# Patient Record
Sex: Male | Born: 1985 | Race: Black or African American | Hispanic: No | Marital: Single | Smoking: Current every day smoker
Health system: Southern US, Community
[De-identification: ages and names within clinical notes are randomized; demographics above are authoritative.]

## PROBLEM LIST (undated history)

## (undated) DIAGNOSIS — C259 Malignant neoplasm of pancreas, unspecified: Secondary | ICD-10-CM

## (undated) DIAGNOSIS — B20 Human immunodeficiency virus [HIV] disease: Secondary | ICD-10-CM

## (undated) DIAGNOSIS — I10 Essential (primary) hypertension: Secondary | ICD-10-CM

## (undated) DIAGNOSIS — Z21 Asymptomatic human immunodeficiency virus [HIV] infection status: Secondary | ICD-10-CM

---
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
## 2006-04-11 ENCOUNTER — Emergency Department (HOSPITAL_COMMUNITY): Admission: EM | Admit: 2006-04-11 | Discharge: 2006-04-11 | Payer: Self-pay | Admitting: Emergency Medicine

## 2006-04-14 ENCOUNTER — Emergency Department (HOSPITAL_COMMUNITY): Admission: EM | Admit: 2006-04-14 | Discharge: 2006-04-14 | Payer: Self-pay | Admitting: Emergency Medicine

## 2006-08-17 ENCOUNTER — Emergency Department (HOSPITAL_COMMUNITY): Admission: EM | Admit: 2006-08-17 | Discharge: 2006-08-17 | Payer: Self-pay | Admitting: Emergency Medicine

## 2006-10-30 ENCOUNTER — Emergency Department (HOSPITAL_COMMUNITY): Admission: EM | Admit: 2006-10-30 | Discharge: 2006-10-31 | Payer: Self-pay | Admitting: Emergency Medicine

## 2006-11-14 ENCOUNTER — Emergency Department (HOSPITAL_COMMUNITY): Admission: EM | Admit: 2006-11-14 | Discharge: 2006-11-14 | Payer: Self-pay | Admitting: Emergency Medicine

## 2006-12-11 ENCOUNTER — Emergency Department (HOSPITAL_COMMUNITY): Admission: EM | Admit: 2006-12-11 | Discharge: 2006-12-11 | Payer: Self-pay | Admitting: Emergency Medicine

## 2007-06-11 ENCOUNTER — Emergency Department (HOSPITAL_COMMUNITY): Admission: EM | Admit: 2007-06-11 | Discharge: 2007-06-11 | Payer: Self-pay | Admitting: Emergency Medicine

## 2007-07-15 ENCOUNTER — Emergency Department (HOSPITAL_COMMUNITY): Admission: EM | Admit: 2007-07-15 | Discharge: 2007-07-15 | Payer: Self-pay | Admitting: Emergency Medicine

## 2007-09-02 ENCOUNTER — Emergency Department (HOSPITAL_COMMUNITY): Admission: EM | Admit: 2007-09-02 | Discharge: 2007-09-02 | Payer: Self-pay | Admitting: Emergency Medicine

## 2008-01-31 ENCOUNTER — Emergency Department (HOSPITAL_COMMUNITY): Admission: EM | Admit: 2008-01-31 | Discharge: 2008-01-31 | Payer: Self-pay | Admitting: Family Medicine

## 2008-05-10 ENCOUNTER — Emergency Department (HOSPITAL_COMMUNITY): Admission: EM | Admit: 2008-05-10 | Discharge: 2008-05-10 | Payer: Self-pay | Admitting: Emergency Medicine

## 2010-01-16 ENCOUNTER — Emergency Department (HOSPITAL_COMMUNITY): Admission: EM | Admit: 2010-01-16 | Discharge: 2010-01-16 | Payer: Self-pay | Admitting: Emergency Medicine

## 2010-03-21 ENCOUNTER — Emergency Department (HOSPITAL_COMMUNITY)
Admission: EM | Admit: 2010-03-21 | Discharge: 2010-03-21 | Payer: Self-pay | Source: Home / Self Care | Admitting: Emergency Medicine

## 2010-06-16 ENCOUNTER — Inpatient Hospital Stay (INDEPENDENT_AMBULATORY_CARE_PROVIDER_SITE_OTHER)
Admission: RE | Admit: 2010-06-16 | Discharge: 2010-06-16 | Disposition: A | Discharge status: Home / Self Care | Payer: Self-pay | Source: Ambulatory Visit | Attending: Family Medicine | Admitting: Family Medicine

## 2010-06-16 DIAGNOSIS — K047 Periapical abscess without sinus: Secondary | ICD-10-CM

## 2010-07-15 ENCOUNTER — Inpatient Hospital Stay (INDEPENDENT_AMBULATORY_CARE_PROVIDER_SITE_OTHER)
Admission: RE | Admit: 2010-07-15 | Discharge: 2010-07-15 | Disposition: A | Discharge status: Home / Self Care | Payer: Self-pay | Source: Ambulatory Visit | Attending: Family Medicine | Admitting: Family Medicine

## 2010-07-15 DIAGNOSIS — S025XXA Fracture of tooth (traumatic), initial encounter for closed fracture: Secondary | ICD-10-CM

## 2010-07-15 DIAGNOSIS — K089 Disorder of teeth and supporting structures, unspecified: Secondary | ICD-10-CM

## 2010-07-16 ENCOUNTER — Inpatient Hospital Stay (INDEPENDENT_AMBULATORY_CARE_PROVIDER_SITE_OTHER)
Admission: RE | Admit: 2010-07-16 | Discharge: 2010-07-16 | Disposition: A | Discharge status: Home / Self Care | Payer: Self-pay | Source: Ambulatory Visit | Attending: Family Medicine | Admitting: Family Medicine

## 2010-07-16 DIAGNOSIS — K089 Disorder of teeth and supporting structures, unspecified: Secondary | ICD-10-CM

## 2010-07-16 DIAGNOSIS — K047 Periapical abscess without sinus: Secondary | ICD-10-CM

## 2010-11-17 LAB — BASIC METABOLIC PANEL
CO2: 22
Calcium: 8.7
Chloride: 107
Creatinine, Ser: 0.72
Glucose, Bld: 124 — ABNORMAL HIGH

## 2010-11-17 LAB — SAMPLE TO BLOOD BANK

## 2010-11-17 LAB — CBC
Hemoglobin: 15.2
MCHC: 34.7
MCV: 93.8
RDW: 13.4

## 2010-11-18 LAB — DIFFERENTIAL
Basophils Absolute: 0
Basophils Relative: 0
Eosinophils Absolute: 0.3
Eosinophils Relative: 3
Lymphocytes Relative: 25
Monocytes Absolute: 0.8

## 2010-11-18 LAB — POCT I-STAT, CHEM 8
BUN: 12
Calcium, Ion: 1.14
HCT: 49
Hemoglobin: 16.7
Sodium: 142
TCO2: 23

## 2010-11-18 LAB — CBC
HCT: 46.4
Hemoglobin: 16.1
MCHC: 34.6
Platelets: 226
RDW: 13.1

## 2010-12-03 LAB — I-STAT 8, (EC8 V) (CONVERTED LAB)
BUN: 6
Bicarbonate: 21.9
Bicarbonate: 25.4 — ABNORMAL HIGH
HCT: 47
HCT: 48
Hemoglobin: 16
Hemoglobin: 16.3
Operator id: 151321
Operator id: 277751
Sodium: 140
Sodium: 142
TCO2: 23
TCO2: 27
pCO2, Ven: 35.6 — ABNORMAL LOW
pCO2, Ven: 43.3 — ABNORMAL LOW

## 2010-12-03 LAB — CBC
HCT: 44.6
MCHC: 33.6
MCV: 94.3
Platelets: 236
Platelets: 255
RDW: 13.2
WBC: 11.1 — ABNORMAL HIGH
WBC: 11.4 — ABNORMAL HIGH

## 2010-12-03 LAB — POCT I-STAT CREATININE
Creatinine, Ser: 0.9
Operator id: 277751

## 2010-12-03 LAB — POCT CARDIAC MARKERS
CKMB, poc: <1 — ABNORMAL LOW
Myoglobin, poc: 35.7
Myoglobin, poc: 99.9
Operator id: 151321
Troponin i, poc: <0.05

## 2010-12-03 LAB — D-DIMER, QUANTITATIVE
D-Dimer, Quant: <0.22
D-Dimer, Quant: <0.22

## 2010-12-03 LAB — DIFFERENTIAL
Basophils Absolute: 0
Basophils Relative: 0
Eosinophils Absolute: 0.1
Eosinophils Relative: 1
Lymphocytes Relative: 35
Lymphs Abs: 3.9 — ABNORMAL HIGH
Neutro Abs: 6.3
Neutro Abs: 7.4
Neutrophils Relative %: 57
Neutrophils Relative %: 65

## 2011-01-23 ENCOUNTER — Emergency Department (HOSPITAL_COMMUNITY)
Admission: EM | Admit: 2011-01-23 | Discharge: 2011-01-23 | Disposition: A | Discharge status: Home / Self Care | Payer: Self-pay | Attending: Emergency Medicine | Admitting: Emergency Medicine

## 2011-01-23 ENCOUNTER — Encounter: Payer: Self-pay | Admitting: Emergency Medicine

## 2011-01-23 ENCOUNTER — Emergency Department (HOSPITAL_COMMUNITY): Payer: Self-pay

## 2011-01-23 DIAGNOSIS — I309 Acute pericarditis, unspecified: Secondary | ICD-10-CM | POA: Insufficient documentation

## 2011-01-23 DIAGNOSIS — R079 Chest pain, unspecified: Secondary | ICD-10-CM | POA: Insufficient documentation

## 2011-01-23 DIAGNOSIS — R0602 Shortness of breath: Secondary | ICD-10-CM | POA: Insufficient documentation

## 2011-01-23 MED ORDER — TRAMADOL HCL 50 MG PO TABS: 50.0000 mg | ORAL_TABLET | Freq: Four times a day (QID) | ORAL | Status: AC | PRN

## 2011-01-23 MED ORDER — NAPROXEN 500 MG PO TABS: 500.0000 mg | ORAL_TABLET | Freq: Two times a day (BID) | ORAL | Status: DC

## 2011-01-23 MED ORDER — OXYCODONE-ACETAMINOPHEN 5-325 MG PO TABS
2.0000 | ORAL_TABLET | Freq: Once | ORAL | Status: AC
  Administered 2011-01-23: 2 via ORAL
  Filled 2011-01-23: qty 2

## 2011-01-23 MED ORDER — IBUPROFEN 800 MG PO TABS
800.0000 mg | ORAL_TABLET | Freq: Once | ORAL | Status: AC
  Administered 2011-01-23: 800 mg via ORAL
  Filled 2011-01-23: qty 1

## 2011-01-23 NOTE — ED Notes (Signed)
Pt c/o of sob since 1am and sharp chest pains that increase with movement. Pt reports that the pain comes and goes with movement and is worse when taking a deep breath. Pt denies fever, n/v/d. Pt in no acute distress.

## 2011-01-23 NOTE — ED Notes (Signed)
ECG done

## 2011-01-23 NOTE — ED Provider Notes (Signed)
History     CSN: [id number] Arrival date & time: 01/23/2011  8:22 AM   First MD Initiated Contact with Patient 01/23/11 0857      No chief complaint on file.   (Consider location/radiation/quality/duration/timing/severity/associated sxs/prior treatment) Patient is a 25 y.o. male presenting with chest pain. The history is provided by the patient. No language interpreter was used.  Chest Pain The chest pain began 6 - 12 hours ago (started acutely at 0100). Chest pain occurs constantly. The chest pain is unchanged. The pain is associated with breathing (movement). The severity of the pain is moderate. The quality of the pain is described as aching and pleuritic. The pain does not radiate. Chest pain is worsened by certain positions and deep breathing. Primary symptoms include shortness of breath. Pertinent negatives for primary symptoms include no fever, no fatigue, no cough, no wheezing, no palpitations, no abdominal pain, no nausea, no vomiting and no dizziness.  Pertinent negatives for associated symptoms include no numbness and no weakness. He tried nothing for the symptoms. Risk factors include male gender and smoking/tobacco exposure.     No past medical history on file.  No past surgical history on file.  No family history on file.  History  Substance Use Topics  . Smoking status: Current Everyday Smoker  . Smokeless tobacco: Not on file  . Alcohol Use: Yes      Review of Systems  Constitutional: Negative for fever, activity change, appetite change and fatigue.  HENT: Negative for congestion, sore throat, rhinorrhea, neck pain and neck stiffness.   Respiratory: Positive for shortness of breath. Negative for cough, chest tightness and wheezing.   Cardiovascular: Positive for chest pain. Negative for palpitations.  Gastrointestinal: Negative for nausea, vomiting and abdominal pain.  Genitourinary: Negative for dysuria, urgency, frequency and flank pain.  Neurological:  Negative for dizziness, weakness, light-headedness, numbness and headaches.  All other systems reviewed and are negative.    Allergies  Review of patient's allergies indicates no known allergies.  Home Medications   Current Outpatient Rx  Name Route Sig Dispense Refill  . IBUPROFEN 100 MG PO TABS Oral Take 200-400 mg by mouth every 6 (six) hours as needed. For pain     . NAPROXEN 500 MG PO TABS Oral Take 1 tablet (500 mg total) by mouth 2 (two) times daily. 30 tablet 0  . TRAMADOL HCL 50 MG PO TABS Oral Take 1 tablet (50 mg total) by mouth every 6 (six) hours as needed for pain. Maximum dose= 8 tablets per day 15 tablet 0    BP 118/61  Pulse 83  Temp 97.8 F (36.6 C)  Resp 20  SpO2 100%  Physical Exam  Nursing note and vitals reviewed. Constitutional: He is oriented to person, place, and time. He appears well-developed and well-nourished. No distress.  HENT:  Head: Normocephalic and atraumatic.  Mouth/Throat: Oropharynx is clear and moist. No oropharyngeal exudate.  Eyes: Conjunctivae and EOM are normal. Pupils are equal, round, and reactive to light.  Neck: Normal range of motion.  Cardiovascular: Normal rate, regular rhythm, normal heart sounds and intact distal pulses.  Exam reveals no gallop and no friction rub.   No murmur heard. Pulmonary/Chest: Effort normal and breath sounds normal. No respiratory distress. He exhibits tenderness.  Abdominal: Soft. Bowel sounds are normal. There is no tenderness. There is no rebound and no guarding.  Musculoskeletal: Normal range of motion. He exhibits no tenderness.  Neurological: He is alert and oriented to person, place, and time.  No cranial nerve deficit.  Skin: Skin is warm and dry. No rash noted.    ED Course  Procedures (including critical care time)   Date: 01/23/2011  Rate: 90  Rhythm: normal sinus rhythm  QRS Axis: normal  Intervals: normal  ST/T Wave abnormalities: ST elevations diffusely and PR depression  consistent with pericarditis  Conduction Disutrbances:none  Narrative Interpretation:   Old EKG Reviewed: none available  Labs Reviewed - No data to display Dg Chest 2 View  01/23/2011  *RADIOLOGY REPORT*  Clinical Data: Chest pain  CHEST - 2 VIEW  Comparison:  07/15/2007  Findings:  The heart size and mediastinal contours are within normal limits.  Both lungs are clear.  The visualized skeletal structures are unremarkable.  IMPRESSION: No active cardiopulmonary disease.  Original Report Authenticated By: Judie Petit. Ruel Favors, M.D.     1. Pericarditis, acute       MDM  Patient is PERC negative with low clinical gestalt for pulmonary embolus. I'm not concerned about a cardiac etiology of his pain such as ACS. EKG is consistent with pericarditis. He'll be treated with NSAIDs and Ultram. To followup with primary care physician as needed. Once again there is no concern about ACS or pulmonary embolus at this time. He is provided clear instructions on signs and symptoms for which to return to the emergency department        Dayton Bailiff, MD 01/23/11 1021

## 2011-02-17 ENCOUNTER — Emergency Department (HOSPITAL_COMMUNITY)
Admission: EM | Admit: 2011-02-17 | Discharge: 2011-02-17 | Discharge status: Against Medical Advice | Payer: Self-pay | Attending: Emergency Medicine | Admitting: Emergency Medicine

## 2011-02-17 ENCOUNTER — Encounter (HOSPITAL_COMMUNITY): Payer: Self-pay | Admitting: Emergency Medicine

## 2011-02-17 DIAGNOSIS — K089 Disorder of teeth and supporting structures, unspecified: Secondary | ICD-10-CM | POA: Insufficient documentation

## 2011-02-17 NOTE — ED Notes (Signed)
PT. REPORTS RIGHT LOWER MOLAR CAVITY/PAIN FOR 2 DAYS .

## 2011-04-15 ENCOUNTER — Emergency Department (HOSPITAL_COMMUNITY)
Admission: EM | Admit: 2011-04-15 | Discharge: 2011-04-15 | Disposition: A | Discharge status: Home / Self Care | Payer: Self-pay | Attending: Emergency Medicine | Admitting: Emergency Medicine

## 2011-04-15 ENCOUNTER — Encounter (HOSPITAL_COMMUNITY): Payer: Self-pay | Admitting: Emergency Medicine

## 2011-04-15 DIAGNOSIS — K0889 Other specified disorders of teeth and supporting structures: Secondary | ICD-10-CM

## 2011-04-15 DIAGNOSIS — K089 Disorder of teeth and supporting structures, unspecified: Secondary | ICD-10-CM | POA: Insufficient documentation

## 2011-04-15 DIAGNOSIS — J45909 Unspecified asthma, uncomplicated: Secondary | ICD-10-CM | POA: Insufficient documentation

## 2011-04-15 MED ORDER — PENICILLIN V POTASSIUM 500 MG PO TABS: 500.0000 mg | ORAL_TABLET | Freq: Three times a day (TID) | ORAL | Status: AC

## 2011-04-15 MED ORDER — HYDROCODONE-ACETAMINOPHEN 5-500 MG PO TABS: 1.0000 | ORAL_TABLET | Freq: Four times a day (QID) | ORAL | Status: AC | PRN

## 2011-04-15 MED ORDER — IBUPROFEN 600 MG PO TABS: 600.0000 mg | ORAL_TABLET | Freq: Four times a day (QID) | ORAL | Status: AC | PRN

## 2011-04-15 NOTE — Discharge Instructions (Signed)
Take ibuprofen for pain. Vicodin as prescribed as needed for severe pain. Take penicillin as prescribed until all gone for possible infection. Follow up with oral surgery.  Dental Pain A tooth ache may be caused by cavities (tooth decay). Cavities expose the nerve of the tooth to air and hot or cold temperatures. It may come from an infection or abscess (also called a boil or furuncle) around your tooth. It is also often caused by dental caries (tooth decay). This causes the pain you are having. DIAGNOSIS  Your caregiver can diagnose this problem by exam. TREATMENT   If caused by an infection, it may be treated with medications which kill germs (antibiotics) and pain medications as prescribed by your caregiver. Take medications as directed.   Only take over-the-counter or prescription medicines for pain, discomfort, or fever as directed by your caregiver.   Whether the tooth ache today is caused by infection or dental disease, you should see your dentist as soon as possible for further care.  SEEK MEDICAL CARE IF: The exam and treatment you received today has been provided on an emergency basis only. This is not a substitute for complete medical or dental care. If your problem worsens or new problems (symptoms) appear, and you are unable to meet with your dentist, call or return to this location. SEEK IMMEDIATE MEDICAL CARE IF:   You have a fever.   You develop redness and swelling of your face, jaw, or neck.   You are unable to open your mouth.   You have severe pain uncontrolled by pain medicine.  MAKE SURE YOU:   Understand these instructions.   Will watch your condition.   Will get help right away if you are not doing well or get worse.  Document Released: 02/08/2005 Document Revised: 10/21/2010 Document Reviewed: 09/27/2007 [facility] Patient Information 2012 [place], [place].

## 2011-04-15 NOTE — ED Provider Notes (Signed)
History     CSN: [id number]  Arrival date & time 04/15/11  1111   First MD Initiated Contact with Patient 04/15/11 1126      Chief Complaint  Patient presents with  . Dental Pain    Right lower first molar broke off last night. States filling has been out for a while. C/O pain    (Consider location/radiation/quality/duration/timing/severity/associated sxs/prior treatment) Patient is a 26 y.o. male presenting with tooth pain. The history is provided by the patient.  Dental PainThe primary symptoms include mouth pain and dental injury. Primary symptoms do not include fever. The symptoms began yesterday. The symptoms are worsening. The symptoms are new. The symptoms occur constantly.  Additional symptoms include: gum swelling and gum tenderness. Additional symptoms do not include: purulent gums, trismus, facial swelling and ear pain.  Pt states he has has problem with this right lower 1st molar for several months. Hs not seen a dentist. States yesterday half of the tooth broke off and he has increased pain. Denies fever, chills, malaise. No facial swelling. No other complaints. Took ibuprofen with no relief. Chewing makes pain worse, nothing makes it better.  Past Medical History  Diagnosis Date  . Asthma     History reviewed. No pertinent past surgical history.  No family history on file.  History  Substance Use Topics  . Smoking status: Current Everyday Smoker  . Smokeless tobacco: Current User  . Alcohol Use: Yes      Review of Systems  Constitutional: Negative for fever and chills.  HENT: Positive for dental problem. Negative for ear pain and facial swelling.   Respiratory: Negative.   Cardiovascular: Negative.   Gastrointestinal: Negative.   Skin: Negative.   Neurological: Negative.     Allergies  Food allergy formula  Home Medications   Current Outpatient Rx  Name Route Sig Dispense Refill  . IBUPROFEN 100 MG PO TABS Oral Take 200-400 mg by mouth every 6  (six) hours as needed. For pain      BP 125/63  Pulse 86  Temp(Src) 98.7 F (37.1 C) (Oral)  Resp 20  SpO2 98%  Physical Exam  Nursing note and vitals reviewed. Constitutional: He is oriented to person, place, and time. He appears well-developed and well-nourished.  HENT:  Head: Normocephalic and atraumatic.       Right 1st 2nd molar decayed, mostly avulsed. Tender with palpation. No surrounding gum erythema, swelling, no signs of an abscess.  Eyes: Conjunctivae are normal.  Neck: Normal range of motion. Neck supple.  Cardiovascular: Normal rate, regular rhythm and normal heart sounds.   Pulmonary/Chest: Effort normal and breath sounds normal. No respiratory distress.  Lymphadenopathy:    He has no cervical adenopathy.  Neurological: He is alert and oriented to person, place, and time.  Skin: Skin is warm and dry.  Psychiatric: He has a normal mood and affect.    ED Course  Procedures (including critical care time)  Dental pain. No signs of an abscess or infection. VS normal. Will d/c home with antibiotics, pain meds, follow up with oral surgery.  No diagnosis found.    MDM          Lottie Mussel, PA 04/15/11 1156

## 2011-04-15 NOTE — ED Notes (Signed)
Tooth broke off last night.

## 2011-04-18 NOTE — ED Provider Notes (Signed)
Medical screening examination/treatment/procedure(s) were performed by non-physician practitioner and as supervising physician I was immediately available for consultation/collaboration.  Falcon Mccaskey, MD 04/18/11 2323 

## 2011-07-05 ENCOUNTER — Emergency Department (HOSPITAL_COMMUNITY)
Admission: EM | Admit: 2011-07-05 | Discharge: 2011-07-05 | Disposition: A | Discharge status: Home / Self Care | Payer: Self-pay

## 2011-07-05 NOTE — ED Notes (Signed)
Called in waiting room with no answer 

## 2011-07-05 NOTE — ED Notes (Signed)
Patient called in the WR with no answer. 

## 2011-10-24 ENCOUNTER — Encounter (HOSPITAL_COMMUNITY): Payer: Self-pay | Admitting: *Deleted

## 2011-10-24 ENCOUNTER — Emergency Department (HOSPITAL_COMMUNITY)
Admission: EM | Admit: 2011-10-24 | Discharge: 2011-10-24 | Disposition: A | Discharge status: Home / Self Care | Payer: Self-pay | Attending: Emergency Medicine | Admitting: Emergency Medicine

## 2011-10-24 DIAGNOSIS — J45909 Unspecified asthma, uncomplicated: Secondary | ICD-10-CM | POA: Insufficient documentation

## 2011-10-24 DIAGNOSIS — F17200 Nicotine dependence, unspecified, uncomplicated: Secondary | ICD-10-CM | POA: Insufficient documentation

## 2011-10-24 DIAGNOSIS — K047 Periapical abscess without sinus: Secondary | ICD-10-CM | POA: Insufficient documentation

## 2011-10-24 MED ORDER — PENICILLIN V POTASSIUM 500 MG PO TABS: 500.0000 mg | ORAL_TABLET | Freq: Four times a day (QID) | ORAL | Status: AC

## 2011-10-24 MED ORDER — IBUPROFEN 600 MG PO TABS: 600.0000 mg | ORAL_TABLET | Freq: Four times a day (QID) | ORAL | Status: AC | PRN

## 2011-10-24 NOTE — ED Notes (Signed)
Pt states "I popped an abscess in my mouth and now I need antibiotics and pain medicine."

## 2011-10-24 NOTE — ED Provider Notes (Signed)
History   This chart was scribed for Mitchell Kaplan, MD by Mitchell Romero. The patient was seen in room TR11C/TR11C and the patient's care was started at 2:27PM.    CSN: [id number]  Arrival date & time 10/24/11  1154   None     Chief Complaint  Patient presents with  . Oral Swelling    (Consider location/radiation/quality/duration/timing/severity/associated sxs/prior treatment) The history is provided by the patient. No language interpreter was used.   Mitchell Romero is a 26 y.o. male who presents to the Emergency Department complaining of constant, moderate, right lower dental pain pertaining to a popped abscess with an onset today. Pt states that he has had chronic problems with the present tooth and abscess; pain always appears when abscess is present. Pt states that he wants abx. No fever, neck pain, sore throat, rash, back pain, CP, SOB, abd pain, n/v/d, dysuria, or extremity pain, edema, weakness, numbness, or tingling. Hx of wisdom dental extraction. No known allergies. No other pertinent medical symptoms.  Past Medical History  Diagnosis Date  . Asthma     History reviewed. No pertinent past surgical history.  History reviewed. No pertinent family history.  History  Substance Use Topics  . Smoking status: Current Everyday Smoker  . Smokeless tobacco: Current User  . Alcohol Use: Yes      Review of Systems 10 Systems reviewed and all are negative for acute change except as noted in the HPI.   Allergies  Food allergy formula  Home Medications  No current outpatient prescriptions on file.  BP 111/61  Pulse 79  Temp 98.3 F (36.8 C) (Oral)  Resp 20  SpO2 98%  Physical Exam  Nursing note and vitals reviewed. Constitutional: He appears well-developed and well-nourished. No distress.  HENT:  Head: Normocephalic and atraumatic.  Right Ear: External ear normal.  Left Ear: External ear normal.       Tooth #29 has no fluctuance, gingival swelling, or abscess.  No pre or post auricular adenopathy.  Eyes: Conjunctivae are normal. Right eye exhibits no discharge. Left eye exhibits no discharge. No scleral icterus.  Neck: Neck supple. No tracheal deviation present.  Cardiovascular: Normal rate, normal heart sounds and intact distal pulses.   No murmur heard. Pulmonary/Chest: Effort normal. No respiratory distress.  Abdominal: Soft. There is no tenderness.  Musculoskeletal: He exhibits no edema and no tenderness.  Lymphadenopathy:    He has no cervical adenopathy.  Neurological: He is alert. He has normal strength. No sensory deficit. He exhibits normal muscle tone. He displays no seizure activity. Coordination normal.  Skin: Skin is warm and dry. No rash noted.  Psychiatric: He has a normal mood and affect.    ED Course  Procedures (including critical care time)  DIAGNOSTIC STUDIES: Oxygen Saturation is 98% on room air, normal by my interpretation.    COORDINATION OF CARE:  2:31PM - pt will be Rx with penicillin VK. Pt ready for d/c.   Labs Reviewed - No data to display No results found.   No diagnosis found.    MDM  DDx includes: - Periapical tooth infection - Dental abscess - Gingivitis - Dental trauma - Pulpitis - Nerve root compression  Pt with poor dentition comes in after he had an abscess that he opened up him self. Exam is benign at this time, except for mild tenderness and poor dentition. Will send with pen v k.  Medical screening examination/treatment/procedure(s) were performed by me as the supervising physician. Scribe service was  utilized for documentation only.       Mitchell Kaplan, MD 10/24/11 1438

## 2011-10-24 NOTE — ED Notes (Signed)
Pt states he popped abscess to right lower lower gum and is here for antibiotics. Pt with dental carry to back right.

## 2011-12-23 ENCOUNTER — Emergency Department (INDEPENDENT_AMBULATORY_CARE_PROVIDER_SITE_OTHER)
Admission: EM | Admit: 2011-12-23 | Discharge: 2011-12-23 | Disposition: A | Discharge status: Home / Self Care | Payer: Self-pay | Source: Home / Self Care | Attending: Emergency Medicine | Admitting: Emergency Medicine

## 2011-12-23 ENCOUNTER — Encounter (HOSPITAL_COMMUNITY): Payer: Self-pay | Admitting: Emergency Medicine

## 2011-12-23 DIAGNOSIS — K5289 Other specified noninfective gastroenteritis and colitis: Secondary | ICD-10-CM

## 2011-12-23 DIAGNOSIS — J45909 Unspecified asthma, uncomplicated: Secondary | ICD-10-CM

## 2011-12-23 DIAGNOSIS — K529 Noninfective gastroenteritis and colitis, unspecified: Secondary | ICD-10-CM

## 2011-12-23 DIAGNOSIS — R110 Nausea: Secondary | ICD-10-CM

## 2011-12-23 DIAGNOSIS — R252 Cramp and spasm: Secondary | ICD-10-CM

## 2011-12-23 MED ORDER — DIPHENOXYLATE-ATROPINE 2.5-0.025 MG PO TABS: 1.0000 | ORAL_TABLET | Freq: Four times a day (QID) | ORAL | Status: DC | PRN

## 2011-12-23 MED ORDER — TRIAMCINOLONE ACETONIDE 0.1 % EX CREA: TOPICAL_CREAM | Freq: Three times a day (TID) | CUTANEOUS | Status: DC

## 2011-12-23 MED ORDER — RANITIDINE HCL 150 MG PO CAPS: 150.0000 mg | ORAL_CAPSULE | Freq: Every day | ORAL | Status: DC

## 2011-12-23 MED ORDER — ONDANSETRON 4 MG PO TBDP
8.0000 mg | ORAL_TABLET | Freq: Once | ORAL | Status: AC
  Administered 2011-12-23: 8 mg via ORAL

## 2011-12-23 MED ORDER — ONDANSETRON 8 MG PO TBDP: 8.0000 mg | ORAL_TABLET | Freq: Three times a day (TID) | ORAL | Status: DC | PRN

## 2011-12-23 MED ORDER — ONDANSETRON 4 MG PO TBDP
ORAL_TABLET | ORAL | Status: AC
  Filled 2011-12-23: qty 2

## 2011-12-23 MED ORDER — ALBUTEROL SULFATE HFA 108 (90 BASE) MCG/ACT IN AERS
2.0000 | INHALATION_SPRAY | RESPIRATORY_TRACT | Status: DC
  Administered 2011-12-23: 2 via RESPIRATORY_TRACT

## 2011-12-23 MED ORDER — ALBUTEROL SULFATE HFA 108 (90 BASE) MCG/ACT IN AERS
INHALATION_SPRAY | RESPIRATORY_TRACT | Status: AC
  Filled 2011-12-23: qty 6.7

## 2011-12-23 NOTE — ED Provider Notes (Signed)
No chief complaint on file.   History of Present Illness:   The patient is a 26 year old male who presents tonight with several issues: Nausea, vomiting, and diarrhea, and ongoing one-month long history of nausea in the morning, asthma, muscle cramps, and eczema.  The nausea, vomiting, and diarrhea has been going on since this morning. He's had some periumbilical, crampy abdominal pain associated with it but no fever. Has felt somewhat chilled. There's been no blood in the vomitus, no coffee-ground emesis, or bilious emesis. No blood in the stool. He's had some mild URI symptoms including sore throat and sneezing. He has not had any sick contacts, suspicious ingestions, foreign travel, or exotic animal exposure. He needs a note for being out of work today.  He also notes a one-month history of nausea when he gets up in the morning. He has a history of reflux esophagitis. He denies any abdominal pain, indigestion, heartburn, waterbrash.  He also has a long-standing history of asthma. He's not had an albuterol inhaler for a while and he would like to have one just to keep on hand. Not been troubled by this much recently.  He also notes cramping of the muscles of his hands and also his legs. The cramping of the hand muscles tends to occur when he uses his hands and the leg muscles tend to occur at nighttime.  He also has eczema and would like a refill on cream for his eczema.  Review of Systems:  Other than noted above, the patient denies any of the following symptoms. Systemic:  No fever, chills, sweats, fatigue, myalgias, headache, or anorexia. Eye:  No redness, pain or drainage. ENT:  No earache, nasal congestion, rhinorrhea, sinus pressure, or sore throat. Lungs:  No cough, sputum production, wheezing, shortness of breath.  Cardiovascular:  No chest pain, palpitations, or syncope. GI:  No nausea, vomiting, abdominal pain or diarrhea. GU:  No dysuria, frequency, or hematuria. Skin:  No rash or  pruritis.  PMFSH:  Past medical history, family history, social history, meds, and allergies were reviewed.   Physical Exam:   Vital signs:  BP 114/63  Pulse 91  Temp 98.7 F (37.1 C) (Oral)  Resp 16  SpO2 98% General:  Alert, in no distress. Eye:  PERRL, full EOMs.  Lids and conjunctivas were normal. ENT:  TMs and canals were normal, without erythema or inflammation.  Nasal mucosa was clear and uncongested, without drainage.  Mucous membranes were moist.  Pharynx was clear, without exudate or drainage.  There were no oral ulcerations or lesions. Neck:  Supple, no adenopathy, tenderness or mass. Thyroid was normal. Lungs:  No respiratory distress.  Lungs were clear to auscultation, without wheezes, rales or rhonchi.  Breath sounds were clear and equal bilaterally. Heart:  Regular rhythm, without gallops, murmers or rubs. Abdomen:  Soft, flat, and non-tender to palpation.  No hepatosplenomagaly or mass. Skin:  Clear, warm, and dry, without rash or lesions.  Course in Urgent Care Center:   He was given Zofran 4 mg sublingually and a sample of albuterol HFA inhaler 2 puffs every 4 hours as needed.  Assessment:  The primary encounter diagnosis was Gastroenteritis. Diagnoses of Nausea, Asthma, and Muscle cramps were also pertinent to this visit.  Plan:   1.  The following meds were prescribed:   New Prescriptions   DIPHENOXYLATE-ATROPINE (LOMOTIL) 2.5-0.025 MG PER TABLET    Take 1 tablet by mouth 4 (four) times daily as needed for diarrhea or loose stools.  ONDANSETRON (ZOFRAN ODT) 8 MG DISINTEGRATING TABLET    Take 1 tablet (8 mg total) by mouth every 8 (eight) hours as needed for nausea.   RANITIDINE (ZANTAC) 150 MG CAPSULE    Take 1 capsule (150 mg total) by mouth daily.   TRIAMCINOLONE CREAM (KENALOG) 0.1 %    Apply topically 3 (three) times daily.   2.  The patient was instructed in symptomatic care and handouts were given. 3.  The patient was told to return if becoming worse in any  way, if no better in 3 or 4 days, and given some red flag symptoms that would indicate earlier return.    [name], MD 12/23/11 2017

## 2011-12-23 NOTE — ED Notes (Signed)
Pt c/o n/v/d that started at 8 a.m, sudden onset.   Pt denies fever or being around any one ill.   Pt unable to keep food down.   Pt has not tried anything to treat his symptoms.

## 2012-04-02 ENCOUNTER — Telehealth (HOSPITAL_COMMUNITY): Payer: Self-pay | Admitting: Emergency Medicine

## 2012-04-02 ENCOUNTER — Emergency Department (HOSPITAL_COMMUNITY)
Admission: EM | Admit: 2012-04-02 | Discharge: 2012-04-02 | Disposition: A | Discharge status: Home / Self Care | Payer: Self-pay | Attending: Emergency Medicine | Admitting: Emergency Medicine

## 2012-04-02 ENCOUNTER — Emergency Department (HOSPITAL_COMMUNITY): Payer: Self-pay

## 2012-04-02 DIAGNOSIS — J45901 Unspecified asthma with (acute) exacerbation: Secondary | ICD-10-CM | POA: Insufficient documentation

## 2012-04-02 DIAGNOSIS — Z79899 Other long term (current) drug therapy: Secondary | ICD-10-CM | POA: Insufficient documentation

## 2012-04-02 DIAGNOSIS — R0602 Shortness of breath: Secondary | ICD-10-CM | POA: Insufficient documentation

## 2012-04-02 DIAGNOSIS — J029 Acute pharyngitis, unspecified: Secondary | ICD-10-CM | POA: Insufficient documentation

## 2012-04-02 DIAGNOSIS — R5381 Other malaise: Secondary | ICD-10-CM | POA: Insufficient documentation

## 2012-04-02 DIAGNOSIS — F17200 Nicotine dependence, unspecified, uncomplicated: Secondary | ICD-10-CM | POA: Insufficient documentation

## 2012-04-02 DIAGNOSIS — R0789 Other chest pain: Secondary | ICD-10-CM | POA: Insufficient documentation

## 2012-04-02 DIAGNOSIS — J069 Acute upper respiratory infection, unspecified: Secondary | ICD-10-CM | POA: Insufficient documentation

## 2012-04-02 DIAGNOSIS — J3489 Other specified disorders of nose and nasal sinuses: Secondary | ICD-10-CM | POA: Insufficient documentation

## 2012-04-02 DIAGNOSIS — R6883 Chills (without fever): Secondary | ICD-10-CM | POA: Insufficient documentation

## 2012-04-02 MED ORDER — ALBUTEROL SULFATE HFA 108 (90 BASE) MCG/ACT IN AERS
2.0000 | INHALATION_SPRAY | Freq: Once | RESPIRATORY_TRACT | Status: DC
  Filled 2012-04-02: qty 6.7

## 2012-04-02 MED ORDER — PREDNISONE 20 MG PO TABS: 10.0000 mg | ORAL_TABLET | Freq: Two times a day (BID) | ORAL | Status: DC

## 2012-04-02 MED ORDER — IPRATROPIUM BROMIDE 0.02 % IN SOLN
0.5000 mg | Freq: Once | RESPIRATORY_TRACT | Status: AC
  Administered 2012-04-02: 0.5 mg via RESPIRATORY_TRACT
  Filled 2012-04-02: qty 2.5

## 2012-04-02 MED ORDER — PREDNISONE 20 MG PO TABS
60.0000 mg | ORAL_TABLET | Freq: Once | ORAL | Status: AC
  Administered 2012-04-02: 60 mg via ORAL
  Filled 2012-04-02: qty 3

## 2012-04-02 MED ORDER — ALBUTEROL SULFATE (5 MG/ML) 0.5% IN NEBU
5.0000 mg | INHALATION_SOLUTION | Freq: Once | RESPIRATORY_TRACT | Status: AC
  Administered 2012-04-02: 5 mg via RESPIRATORY_TRACT
  Filled 2012-04-02: qty 1

## 2012-04-02 NOTE — ED Provider Notes (Signed)
Medical screening examination/treatment/procedure(s) were performed by non-physician practitioner and as supervising physician I was immediately available for consultation/collaboration.  Flint Melter, MD 04/02/12 [phone]

## 2012-04-02 NOTE — ED Notes (Signed)
Presents with nasal congestion, productive cough with yellow sputum, chills. Headaches, sore throat for 2 days. C/o chest pain with cough. SAts 98%.

## 2012-04-02 NOTE — ED Provider Notes (Signed)
History     CSN: [id number]  Arrival date & time 04/02/12  0052   None     Chief Complaint  Patient presents with  . Cough    (Consider location/radiation/quality/duration/timing/severity/associated sxs/prior treatment) HPI History provided by pt.   Pt w/ h/o asthma presents w/ 2 days of cough, chest tightness and shortness of breath.  Associated w/ sore throat, nasal congestion, rhinorrhea, chills, generalized weakness and body aches.  Denies fever and wheezing.  No known sick contacts.   Past Medical History  Diagnosis Date  . Asthma     No past surgical history on file.  No family history on file.  History  Substance Use Topics  . Smoking status: Current Every Day Smoker -- 0.50 packs/day    Types: Cigarettes  . Smokeless tobacco: Current User  . Alcohol Use: Yes      Review of Systems  All other systems reviewed and are negative.    Allergies  Peanut-containing drug products  Home Medications   Current Outpatient Rx  Name  Route  Sig  Dispense  Refill  . albuterol (PROVENTIL HFA;VENTOLIN HFA) 108 (90 BASE) MCG/ACT inhaler   Inhalation   Inhale 2 puffs into the lungs every 6 (six) hours as needed for wheezing.           BP 107/71  Pulse 86  Temp(Src) 97.6 F (36.4 C) (Oral)  SpO2 99%  Physical Exam  Nursing note and vitals reviewed. Constitutional: He is oriented to person, place, and time. He appears well-developed and well-nourished. No distress.  HENT:  Head: Normocephalic and atraumatic.  Mouth/Throat: Oropharynx is clear and moist.  No tonsillar edema/exudate.    Eyes:  Normal appearance  Neck: Normal range of motion. Neck supple.  Cardiovascular: Normal rate and regular rhythm.   Pulmonary/Chest: Effort normal. No respiratory distress.  Diminished breath sounds throughout.  No wheezing.   Musculoskeletal: Normal range of motion.  No LE edema/ttp  Lymphadenopathy:    He has cervical adenopathy.  Neurological: He is alert and  oriented to person, place, and time.  Skin: Skin is warm and dry. No rash noted.  Psychiatric: He has a normal mood and affect. His behavior is normal.    ED Course  Procedures (including critical care time)  Labs Reviewed - No data to display Dg Chest 2 View  04/02/2012  *RADIOLOGY REPORT*  Clinical Data: Productive cough, congestion, chills, and sore throat for 3 days.  CHEST - 2 VIEW  Comparison: 01/23/2011  Findings: The heart size and pulmonary vascularity are normal. The lungs appear clear and expanded without focal air space disease or consolidation. No blunting of the costophrenic angles.  No pneumothorax.  Mediastinal contours appear intact.  No significant change since previous study.  IMPRESSION: No evidence of active pulmonary disease.   Original Report Authenticated By: Burman Nieves, M.D.      1. Viral URI   2. Asthma exacerbation       MDM  27yo M w/ h/o asthma presents w/ URI sx + chest tightness and SOB x 2 days.  Received a breathing treatment in triage and reports that his sx are improved.  On exam, afebrile, VS w/in nml range, no respiratory distress, diminished breath sounds.  CXR negative for pneumonia.  Suspect virus w/ secondary asthma exacerbation.  Will try a second breathing treatment and reassess shortly.  3:07 AM   On repeat exam, VSS, pt moving air better, diffuse, mild expiratory wheezing.  Pt did not become dyspneic  or drop his O2 sat while ambulating.  Provided him w/ albuterol inhaler and recommended sudafed and ibuprofen at home.  Return precautions discussed.  4:28 AM        Otilio Miu, PA-C 04/02/12 [phone]

## 2012-04-03 ENCOUNTER — Encounter (HOSPITAL_COMMUNITY): Payer: Self-pay | Admitting: Emergency Medicine

## 2012-04-03 ENCOUNTER — Emergency Department (HOSPITAL_COMMUNITY): Payer: No Typology Code available for payment source

## 2012-04-03 ENCOUNTER — Emergency Department (HOSPITAL_COMMUNITY)
Admission: EM | Admit: 2012-04-03 | Discharge: 2012-04-03 | Disposition: A | Discharge status: Home / Self Care | Payer: No Typology Code available for payment source | Attending: Emergency Medicine | Admitting: Emergency Medicine

## 2012-04-03 DIAGNOSIS — S59909A Unspecified injury of unspecified elbow, initial encounter: Secondary | ICD-10-CM | POA: Insufficient documentation

## 2012-04-03 DIAGNOSIS — Z79899 Other long term (current) drug therapy: Secondary | ICD-10-CM | POA: Insufficient documentation

## 2012-04-03 DIAGNOSIS — S6990XA Unspecified injury of unspecified wrist, hand and finger(s), initial encounter: Secondary | ICD-10-CM | POA: Insufficient documentation

## 2012-04-03 DIAGNOSIS — Y92410 Unspecified street and highway as the place of occurrence of the external cause: Secondary | ICD-10-CM | POA: Insufficient documentation

## 2012-04-03 DIAGNOSIS — Y9389 Activity, other specified: Secondary | ICD-10-CM | POA: Insufficient documentation

## 2012-04-03 DIAGNOSIS — J45909 Unspecified asthma, uncomplicated: Secondary | ICD-10-CM | POA: Insufficient documentation

## 2012-04-03 DIAGNOSIS — F17200 Nicotine dependence, unspecified, uncomplicated: Secondary | ICD-10-CM | POA: Insufficient documentation

## 2012-04-03 MED ORDER — LORAZEPAM 1 MG PO TABS
1.0000 mg | ORAL_TABLET | Freq: Once | ORAL | Status: AC
  Administered 2012-04-03: 1 mg via ORAL
  Filled 2012-04-03: qty 2

## 2012-04-03 MED ORDER — NAPROXEN 500 MG PO TABS: 500.0000 mg | ORAL_TABLET | Freq: Two times a day (BID) | ORAL | Status: DC

## 2012-04-03 MED ORDER — OXYCODONE-ACETAMINOPHEN 5-325 MG PO TABS
1.0000 | ORAL_TABLET | Freq: Once | ORAL | Status: AC
  Administered 2012-04-03: 1 via ORAL
  Filled 2012-04-03: qty 1

## 2012-04-03 MED ORDER — IBUPROFEN 400 MG PO TABS
400.0000 mg | ORAL_TABLET | Freq: Once | ORAL | Status: AC
  Administered 2012-04-03: 400 mg via ORAL
  Filled 2012-04-03: qty 1

## 2012-04-03 MED ORDER — HYDROCODONE-ACETAMINOPHEN 5-325 MG PO TABS: 1.0000 | ORAL_TABLET | Freq: Four times a day (QID) | ORAL | Status: DC | PRN
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
## 2012-04-03 NOTE — ED Notes (Signed)
Patient transported to X-ray 
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
## 2012-04-03 NOTE — ED Provider Notes (Signed)
Medical screening examination/treatment/procedure(s) were performed by non-physician practitioner and as supervising physician I was immediately available for consultation/collaboration.   Anberlin Diez M Lennette Fader, DO 04/03/12 2145 

## 2012-04-19 ENCOUNTER — Emergency Department (HOSPITAL_COMMUNITY)
Admission: EM | Admit: 2012-04-19 | Discharge: 2012-04-19 | Disposition: A | Discharge status: Home / Self Care | Payer: No Typology Code available for payment source | Attending: Emergency Medicine | Admitting: Emergency Medicine

## 2012-04-19 ENCOUNTER — Encounter (HOSPITAL_COMMUNITY): Payer: Self-pay

## 2012-04-19 ENCOUNTER — Emergency Department (HOSPITAL_COMMUNITY): Payer: No Typology Code available for payment source

## 2012-04-19 DIAGNOSIS — S6990XA Unspecified injury of unspecified wrist, hand and finger(s), initial encounter: Secondary | ICD-10-CM | POA: Insufficient documentation

## 2012-04-19 DIAGNOSIS — W230XXA Caught, crushed, jammed, or pinched between moving objects, initial encounter: Secondary | ICD-10-CM | POA: Insufficient documentation

## 2012-04-19 DIAGNOSIS — J45909 Unspecified asthma, uncomplicated: Secondary | ICD-10-CM | POA: Insufficient documentation

## 2012-04-19 DIAGNOSIS — Y939 Activity, unspecified: Secondary | ICD-10-CM | POA: Insufficient documentation

## 2012-04-19 DIAGNOSIS — Y929 Unspecified place or not applicable: Secondary | ICD-10-CM | POA: Insufficient documentation

## 2012-04-19 DIAGNOSIS — F17200 Nicotine dependence, unspecified, uncomplicated: Secondary | ICD-10-CM | POA: Insufficient documentation

## 2012-04-19 DIAGNOSIS — Z23 Encounter for immunization: Secondary | ICD-10-CM | POA: Insufficient documentation

## 2012-04-19 DIAGNOSIS — Z79899 Other long term (current) drug therapy: Secondary | ICD-10-CM | POA: Insufficient documentation

## 2012-04-19 MED ORDER — TETANUS-DIPHTH-ACELL PERTUSSIS 5-2.5-18.5 LF-MCG/0.5 IM SUSP
0.5000 mL | Freq: Once | INTRAMUSCULAR | Status: AC
  Administered 2012-04-19: 0.5 mL via INTRAMUSCULAR
  Filled 2012-04-19: qty 0.5

## 2012-04-19 NOTE — ED Notes (Addendum)
Patient presents with left thumb injury after accidentally closing it in a car door around 2 pm this afternoon. Nail bed is split in half. No active bleeding. Describes pain as throbbing and tingling. Rates 5/10. Denies any numbness. Sensation intact. No c/o pain in other fingers, hand, wrist or arm. Last tetanus immunization unknown.

## 2012-04-19 NOTE — ED Provider Notes (Signed)
History     CSN: [id number]  Arrival date & time 04/19/12  0316   First MD Initiated Contact with Patient 04/19/12 [phone]      Chief Complaint  Patient presents with  . Finger Injury     HPI Patient presents to the emergency room after closing his thumb in a car door around 2 PM this afternoon.  Patient has noticed bleeding and oozing from the nail bed since he injured it. He has a throbbing pain that increases with palpation and movement. Denies any numbness or weakness. He denies any other injuries Past Medical History  Diagnosis Date  . Asthma     History reviewed. No pertinent past surgical history.  No family history on file.  History  Substance Use Topics  . Smoking status: Current Every Day Smoker -- 0.50 packs/day    Types: Cigarettes  . Smokeless tobacco: Never Used  . Alcohol Use: Yes      Review of Systems  Constitutional: Negative for fever.  Neurological: Negative for weakness and numbness.  Hematological: Does not bruise/bleed easily.    Allergies  Peanut-containing drug products and Powder  Home Medications   Current Outpatient Rx  Name  Route  Sig  Dispense  Refill  . albuterol (PROVENTIL HFA;VENTOLIN HFA) 108 (90 BASE) MCG/ACT inhaler   Inhalation   Inhale 2 puffs into the lungs every 6 (six) hours as needed for wheezing.         Marland Kitchen ibuprofen (ADVIL,MOTRIN) 200 MG tablet   Oral   Take 200-600 mg by mouth every 6 (six) hours as needed for pain.            BP 114/60  Pulse 89  Temp(Src) 99.4 F (37.4 C) (Oral)  Resp 20  SpO2 95%  Physical Exam  Nursing note and vitals reviewed. Constitutional: He appears well-developed and well-nourished. No distress.  HENT:  Head: Normocephalic and atraumatic.  Right Ear: External ear normal.  Left Ear: External ear normal.  Eyes: Conjunctivae are normal. Right eye exhibits no discharge. Left eye exhibits no discharge. No scleral icterus.  Neck: Neck supple. No tracheal deviation present.   Cardiovascular: Normal rate.   Pulmonary/Chest: Effort normal. No stridor. No respiratory distress.  Musculoskeletal: He exhibits no edema.  Laceration through the nailbed midway through the left thumb, proximally half the width of the nail is lacerated, close approximation of the margins, no avulsion  Neurological: He is alert. Cranial nerve deficit: no gross deficits.  Skin: Skin is warm and dry. No rash noted.  Psychiatric: He has a normal mood and affect.    ED Course  Procedures (including critical care time)  Labs Reviewed - No data to display Dg Finger Thumb Left  04/19/2012  *RADIOLOGY REPORT*  Clinical Data: Laceration to the distal and nail after shut in car door.  LEFT THUMB 2+V  Comparison: None.  Findings: Left first finger appears intact.  No evidence of acute fracture or subluxation.  No focal bone lesion or bone destruction. Bone cortex and trabecular architecture appear intact.  No radiopaque soft tissue foreign bodies.  IMPRESSION: No acute bony abnormalities in the left first finger.   Original Report Authenticated By: Burman Nieves, M.D.      1. Nailbed injury, left, initial encounter       MDM  No fracture noted on xray.  Wound was cleaned.  Applied steristrip and dermabond to the nail margin to help prevent the nail catching clothing etc.  Celene Kras, MD 04/19/12 [phone]

## 2012-04-23 ENCOUNTER — Emergency Department (INDEPENDENT_AMBULATORY_CARE_PROVIDER_SITE_OTHER)
Admission: EM | Admit: 2012-04-23 | Discharge: 2012-04-23 | Disposition: A | Discharge status: Home / Self Care | Payer: Self-pay | Source: Home / Self Care | Attending: Emergency Medicine | Admitting: Emergency Medicine

## 2012-04-23 ENCOUNTER — Encounter (HOSPITAL_COMMUNITY): Payer: Self-pay | Admitting: *Deleted

## 2012-04-23 MED ORDER — IBUPROFEN 800 MG PO TABS
ORAL_TABLET | ORAL | Status: AC
  Filled 2012-04-23: qty 1

## 2012-04-23 MED ORDER — IBUPROFEN 800 MG PO TABS
800.0000 mg | ORAL_TABLET | Freq: Once | ORAL | Status: AC
  Administered 2012-04-23: 800 mg via ORAL

## 2012-04-23 MED ORDER — ISOMETHEPTENE-APAP-DICHLORAL 65-325-100 MG PO CAPS: ORAL_CAPSULE | ORAL | Status: DC

## 2012-04-23 NOTE — ED Notes (Signed)
C/O frequent HAs over past 2 wks; denies hx migraines.  Woke this morning with HA.  Has some nausea associated with HAs, but denies vision changes or photosensitivity.  Has not tried any meds or taken any measures to help alleviate sxs.

## 2012-04-23 NOTE — ED Provider Notes (Signed)
Chief Complaint  Patient presents with  . Headache    History of Present Illness:   Mitchell Romero is a 27 year old male who has had a two-week history of recurring headaches. These occur about 2 or 3 times per week and can last for hours or days at a time. It is located in the right side of the head and throbbing in nature rated sometimes a 10 over 10 but right now they're down to 3 or 4/10 in intensity. They're associated with nausea, occasional vomiting, lack of energy, photophobia, and the patient feels he does wants to lie down when he has a headache. He denies any photophobia or osmophobia. He does note some white specks in his visual field but no flashing lights or zigzag lines, or flickering scotomata. When he has the headache he has no energy and feels weak. He felt somewhat chilled, his neck has felt stiff and is an occasional sweats. He has no history of migraine headaches but does have a history of high blood pressure in the past. He denies any fever or neurological symptoms or  Review of Systems:  Other than noted above, the patient denies any of the following symptoms: Systemic:  No fever, chills, fatigue, photophobia, stiff neck. Eye:  No redness, eye pain, discharge, blurred vision, or diplopia. ENT:  No nasal congestion, rhinorrhea, sinus pressure or pain, sneezing, earache, or sore throat.  No jaw claudication. Neuro:  No paresthesias, loss of consciousness, seizure activity, muscle weakness, trouble with coordination or gait, trouble speaking or swallowing. Psych:  No depression, anxiety or trouble sleeping.  PMFSH:  Past medical history, family history, social history, meds, and allergies were reviewed.  Physical Exam:   Vital signs:  BP 110/70  Pulse 77  Temp(Src) 99.6 F (37.6 C) (Oral)  Resp 17  SpO2 98% General:  Alert and oriented.  In no distress. Eye:  Lids and conjunctivas normal.  PERRL,  Full EOMs.  Fundi benign with normal discs and vessels. ENT:  No cranial or  facial tenderness to palpation.  TMs and canals clear.  Nasal mucosa was normal and uncongested without any drainage. No intra oral lesions, pharynx clear, mucous membranes moist, dentition normal. Neck:  Supple, full ROM, no tenderness to palpation.  No adenopathy or mass. Neuro:  Alert and orented times 3.  Speech was clear, fluent, and appropriate.  Cranial nerves intact. No pronator drift, muscle strength normal. Finger to nose normal.  DTRs were 2+ and symmetrical.Station and gait were normal.  Romberg's sign was normal.  Able to perform tandem gait well. Psych:  Normal affect.  Assessment:  The encounter diagnosis was Migraine headache.  Plan:   1.  The following meds were prescribed:   Discharge Medication List as of 04/23/2012 11:54 AM    START taking these medications   Details  isometheptene-acetaminophen-dichloralphenazone (MIDRIN) 65-325-100 MG capsule 2 at onset of headache and 1 every 1 hour as needed up to 5 per day, for migraine headache., Print       2.  The patient was instructed in symptomatic care and handouts were given. 3.  The patient was told to return if becoming worse in any way, if no better in 3 or 4 days, and given some red flag symptoms that would indicate earlier return.  Follow up:  The patient was told to follow up with Dr. Karenann Cai.     [name], MD 04/23/12 2015

## 2012-06-03 ENCOUNTER — Emergency Department (HOSPITAL_COMMUNITY)
Admission: EM | Admit: 2012-06-03 | Discharge: 2012-06-04 | Disposition: A | Discharge status: Home / Self Care | Payer: Self-pay | Attending: Emergency Medicine | Admitting: Emergency Medicine

## 2012-06-03 DIAGNOSIS — F17200 Nicotine dependence, unspecified, uncomplicated: Secondary | ICD-10-CM | POA: Insufficient documentation

## 2012-06-03 DIAGNOSIS — R109 Unspecified abdominal pain: Secondary | ICD-10-CM | POA: Insufficient documentation

## 2012-06-03 DIAGNOSIS — Z79899 Other long term (current) drug therapy: Secondary | ICD-10-CM | POA: Insufficient documentation

## 2012-06-03 DIAGNOSIS — J45909 Unspecified asthma, uncomplicated: Secondary | ICD-10-CM | POA: Insufficient documentation

## 2012-06-03 DIAGNOSIS — R112 Nausea with vomiting, unspecified: Secondary | ICD-10-CM | POA: Insufficient documentation

## 2012-06-03 DIAGNOSIS — R630 Anorexia: Secondary | ICD-10-CM | POA: Insufficient documentation

## 2012-06-03 NOTE — ED Notes (Signed)
Pt states mid to lower right side Abdominal pain x 2 days. Started vomiting today x 5

## 2012-06-04 ENCOUNTER — Encounter (HOSPITAL_COMMUNITY): Payer: Self-pay | Admitting: Emergency Medicine

## 2012-06-04 ENCOUNTER — Emergency Department (HOSPITAL_COMMUNITY): Payer: Self-pay

## 2012-06-04 LAB — CBC WITH DIFFERENTIAL/PLATELET
Basophils Absolute: 0 10*3/uL (ref 0.0–0.1)
Basophils Relative: 0 % (ref 0–1)
Eosinophils Absolute: 0.1 10*3/uL (ref 0.0–0.7)
Eosinophils Relative: 1 % (ref 0–5)
HCT: 41.3 % (ref 39.0–52.0)
MCH: 32.4 pg (ref 26.0–34.0)
MCHC: 37.5 g/dL — ABNORMAL HIGH (ref 30.0–36.0)
MCV: 86.4 fL (ref 78.0–100.0)
Monocytes Absolute: 0.8 10*3/uL (ref 0.1–1.0)
Neutro Abs: 6.7 10*3/uL (ref 1.7–7.7)
RDW: 12.7 % (ref 11.5–15.5)

## 2012-06-04 LAB — COMPREHENSIVE METABOLIC PANEL
ALT: 23 U/L (ref 0–53)
Alkaline Phosphatase: 47 U/L (ref 39–117)
CO2: 26 mEq/L (ref 19–32)
GFR calc Af Amer: >90 mL/min (ref >90)
Glucose, Bld: 102 mg/dL — ABNORMAL HIGH (ref 70–99)
Potassium: 4 mEq/L (ref 3.5–5.1)
Sodium: 140 mEq/L (ref 135–145)
Total Protein: 7.4 g/dL (ref 6.0–8.3)

## 2012-06-04 LAB — URINALYSIS, ROUTINE W REFLEX MICROSCOPIC
Bilirubin Urine: NEGATIVE
Ketones, ur: 15 mg/dL — AB
Nitrite: NEGATIVE
Protein, ur: NEGATIVE mg/dL
Urobilinogen, UA: 1 mg/dL (ref 0.0–1.0)

## 2012-06-04 MED ORDER — PROMETHAZINE HCL 25 MG PO TABS: 25.0000 mg | ORAL_TABLET | Freq: Four times a day (QID) | ORAL | Status: DC | PRN

## 2012-06-04 MED ORDER — OXYCODONE-ACETAMINOPHEN 5-325 MG PO TABS: 1.0000 | ORAL_TABLET | Freq: Four times a day (QID) | ORAL | Status: DC | PRN

## 2012-06-04 MED ORDER — IOHEXOL 300 MG/ML  SOLN
50.0000 mL | Freq: Once | INTRAMUSCULAR | Status: AC | PRN
  Administered 2012-06-04: 50 mL via ORAL

## 2012-06-04 MED ORDER — MORPHINE SULFATE 4 MG/ML IJ SOLN
4.0000 mg | Freq: Once | INTRAMUSCULAR | Status: DC
  Filled 2012-06-04: qty 1

## 2012-06-04 MED ORDER — IOHEXOL 300 MG/ML  SOLN
100.0000 mL | Freq: Once | INTRAMUSCULAR | Status: AC | PRN
  Administered 2012-06-04: 100 mL via INTRAVENOUS

## 2012-06-04 MED ORDER — SODIUM CHLORIDE 0.9 % IV BOLUS (SEPSIS)
1000.0000 mL | Freq: Once | INTRAVENOUS | Status: AC
  Administered 2012-06-04: 1000 mL via INTRAVENOUS

## 2012-06-04 MED ORDER — ONDANSETRON HCL 4 MG/2ML IJ SOLN
4.0000 mg | Freq: Once | INTRAMUSCULAR | Status: AC
  Administered 2012-06-04: 4 mg via INTRAVENOUS
  Filled 2012-06-04: qty 2

## 2012-06-04 NOTE — ED Notes (Signed)
Pt states understanding of discharge instructions 

## 2012-06-04 NOTE — ED Notes (Signed)
Pt presents with lower abdominal pain that started Saturday morning.  N/V/D associated with pain.  Pt denies any difficulty urinating, denies any penile discharge.  Lower abdomen tender to palpate.  Will continue to monitor pt.

## 2012-06-04 NOTE — ED Provider Notes (Signed)
History     CSN: [id number]  Arrival date & time 06/03/12  2350   First MD Initiated Contact with Patient 06/04/12 0016      Chief Complaint  Patient presents with  . Abdominal Pain    (Consider location/radiation/quality/duration/timing/severity/associated sxs/prior treatment) Patient is a 27 y.o. male presenting with abdominal pain. The history is provided by the patient.  Abdominal Pain Associated symptoms: nausea and vomiting   Associated symptoms: no chest pain, no diarrhea and no shortness of breath    patient presents with abdominal pain. States it started in his lower abdomen is now more the middle abdomen. No fevers. He's had a decreased appetite. He has had nausea and vomiting. No dysuria. He states he has been having more frequent stools but not diarrhea. No clear sick contacts.  Past Medical History  Diagnosis Date  . Asthma     History reviewed. No pertinent past surgical history.  No family history on file.  History  Substance Use Topics  . Smoking status: Current Every Day Smoker -- 0.50 packs/day    Types: Cigarettes  . Smokeless tobacco: Never Used  . Alcohol Use: Yes     Comment: occasional      Review of Systems  Constitutional: Positive for appetite change. Negative for activity change.  HENT: Negative for neck stiffness.   Eyes: Negative for pain.  Respiratory: Negative for chest tightness and shortness of breath.   Cardiovascular: Negative for chest pain and leg swelling.  Gastrointestinal: Positive for nausea, vomiting and abdominal pain. Negative for diarrhea.  Genitourinary: Negative for flank pain.  Musculoskeletal: Negative for back pain.  Skin: Negative for rash.  Neurological: Negative for weakness, numbness and headaches.  Psychiatric/Behavioral: Negative for behavioral problems.    Allergies  Peanut-containing drug products and Powder  Home Medications   Current Outpatient Rx  Name  Route  Sig  Dispense  Refill  . albuterol  (PROVENTIL HFA;VENTOLIN HFA) 108 (90 BASE) MCG/ACT inhaler   Inhalation   Inhale 2 puffs into the lungs every 6 (six) hours as needed for wheezing.         Marland Kitchen oxyCODONE-acetaminophen (PERCOCET/ROXICET) 5-325 MG per tablet   Oral   Take 1-2 tablets by mouth every 6 (six) hours as needed for pain.   10 tablet   0   . promethazine (PHENERGAN) 25 MG tablet   Oral   Take 1 tablet (25 mg total) by mouth every 6 (six) hours as needed for nausea.   10 tablet   0     BP 98/53  Pulse 66  Temp(Src) 97.6 F (36.4 C) (Oral)  Resp 16  SpO2 99%  Physical Exam  Nursing note and vitals reviewed. Constitutional: He is oriented to person, place, and time. He appears well-developed and well-nourished.  HENT:  Head: Normocephalic and atraumatic.  Eyes: EOM are normal. Pupils are equal, round, and reactive to light.  Neck: Normal range of motion. Neck supple.  Cardiovascular: Normal rate, regular rhythm and normal heart sounds.   No murmur heard. Pulmonary/Chest: Effort normal and breath sounds normal.  Abdominal: Soft. Bowel sounds are normal. He exhibits no distension and no mass. There is tenderness. There is no rebound and no guarding.  Tenderness over suprapubic to right lower quadrant abdomen. No rebound or guarding. No rash  Musculoskeletal: Normal range of motion. He exhibits no edema.  Neurological: He is alert and oriented to person, place, and time. No cranial nerve deficit.  Skin: Skin is warm and dry.  Psychiatric: He has a normal mood and affect.    ED Course  Procedures (including critical care time)  Labs Reviewed  COMPREHENSIVE METABOLIC PANEL - Abnormal; Notable for the following:    Glucose, Bld 102 (*)    All other components within normal limits  CBC WITH DIFFERENTIAL - Abnormal; Notable for the following:    WBC 10.6 (*)    MCHC 37.5 (*)    All other components within normal limits  URINALYSIS, ROUTINE W REFLEX MICROSCOPIC - Abnormal; Notable for the following:     APPearance HAZY (*)    Ketones, ur 15 (*)    All other components within normal limits  LIPASE, BLOOD   Ct Abdomen Pelvis W Contrast  06/04/2012  *RADIOLOGY REPORT*  Clinical Data: Right lower quadrant pain.  Vomiting.  CT ABDOMEN AND PELVIS WITH CONTRAST  Technique:  Multidetector CT imaging of the abdomen and pelvis was performed following the standard protocol during bolus administration of intravenous contrast.  Contrast: OMNIPAQUE IOHEXOL 300 MG/ML  SOLN  Comparison: None.  Findings: The abdominal parenchymal organs are normal in appearance.  Gallbladder is unremarkable.  No evidence of hydronephrosis.  No soft tissue masses or lymphadenopathy identified.  No evidence of inflammatory process or abnormal fluid collections. Normal appendix is visualized.  No evidence of bowel wall thickening, dilatation, or hernia.  No suspicious bone lesions are identified.  IMPRESSION: Negative.  No evidence of appendicitis or other acute findings.   Original Report Authenticated By: Myles Rosenthal, M.D.      1. Abdominal pain       MDM  Patient with nausea vomiting and abdominal pain. His right lower quadrant tenderness, but a negative CT. Patient's tolerate orals and will be discharged.        Juliet Rude. Rubin Payor, MD 06/04/12 [phone]

## 2012-07-04 ENCOUNTER — Emergency Department (HOSPITAL_COMMUNITY): Payer: Self-pay

## 2012-07-04 ENCOUNTER — Emergency Department (HOSPITAL_COMMUNITY)
Admission: EM | Admit: 2012-07-04 | Discharge: 2012-07-04 | Disposition: A | Discharge status: Home / Self Care | Payer: Self-pay | Attending: Emergency Medicine | Admitting: Emergency Medicine

## 2012-07-04 ENCOUNTER — Encounter (HOSPITAL_COMMUNITY): Payer: Self-pay | Admitting: Emergency Medicine

## 2012-07-04 DIAGNOSIS — J45901 Unspecified asthma with (acute) exacerbation: Secondary | ICD-10-CM | POA: Insufficient documentation

## 2012-07-04 DIAGNOSIS — R0602 Shortness of breath: Secondary | ICD-10-CM | POA: Insufficient documentation

## 2012-07-04 DIAGNOSIS — F17200 Nicotine dependence, unspecified, uncomplicated: Secondary | ICD-10-CM | POA: Insufficient documentation

## 2012-07-04 DIAGNOSIS — J302 Other seasonal allergic rhinitis: Secondary | ICD-10-CM

## 2012-07-04 DIAGNOSIS — J309 Allergic rhinitis, unspecified: Secondary | ICD-10-CM | POA: Insufficient documentation

## 2012-07-04 DIAGNOSIS — J45909 Unspecified asthma, uncomplicated: Secondary | ICD-10-CM

## 2012-07-04 DIAGNOSIS — Z79899 Other long term (current) drug therapy: Secondary | ICD-10-CM | POA: Insufficient documentation

## 2012-07-04 DIAGNOSIS — R0789 Other chest pain: Secondary | ICD-10-CM

## 2012-07-04 DIAGNOSIS — R071 Chest pain on breathing: Secondary | ICD-10-CM | POA: Insufficient documentation

## 2012-07-04 LAB — BASIC METABOLIC PANEL
CO2: 26 mEq/L (ref 19–32)
Calcium: 9.4 mg/dL (ref 8.4–10.5)
Glucose, Bld: 103 mg/dL — ABNORMAL HIGH (ref 70–99)
Sodium: 141 mEq/L (ref 135–145)

## 2012-07-04 LAB — CBC
Hemoglobin: 15.4 g/dL (ref 13.0–17.0)
MCH: 33 pg (ref 26.0–34.0)
MCV: 89.3 fL (ref 78.0–100.0)
RBC: 4.67 MIL/uL (ref 4.22–5.81)

## 2012-07-04 LAB — POCT I-STAT TROPONIN I

## 2012-07-04 MED ORDER — LORATADINE 10 MG PO TABS: 10.0000 mg | ORAL_TABLET | Freq: Every day | ORAL | Status: DC

## 2012-07-04 MED ORDER — IBUPROFEN 800 MG PO TABS
800.0000 mg | ORAL_TABLET | Freq: Once | ORAL | Status: AC
  Administered 2012-07-04: 800 mg via ORAL
  Filled 2012-07-04: qty 1

## 2012-07-04 MED ORDER — ALBUTEROL SULFATE HFA 108 (90 BASE) MCG/ACT IN AERS
2.0000 | INHALATION_SPRAY | RESPIRATORY_TRACT | Status: DC | PRN
  Administered 2012-07-04: 2 via RESPIRATORY_TRACT
  Filled 2012-07-04: qty 6.7

## 2012-07-04 MED ORDER — IPRATROPIUM BROMIDE 0.02 % IN SOLN
0.5000 mg | RESPIRATORY_TRACT | Status: DC
  Administered 2012-07-04: 0.5 mg via RESPIRATORY_TRACT
  Filled 2012-07-04: qty 2.5

## 2012-07-04 MED ORDER — AEROCHAMBER PLUS FLO-VU LARGE MISC
1.0000 | Freq: Once | Status: AC
  Administered 2012-07-04: 1
  Filled 2012-07-04: qty 1

## 2012-07-04 MED ORDER — ALBUTEROL SULFATE (5 MG/ML) 0.5% IN NEBU
2.5000 mg | INHALATION_SOLUTION | RESPIRATORY_TRACT | Status: DC
  Administered 2012-07-04: 2.5 mg via RESPIRATORY_TRACT
  Filled 2012-07-04: qty 0.5

## 2012-07-04 MED ORDER — LORATADINE 10 MG PO TABS
10.0000 mg | ORAL_TABLET | ORAL | Status: AC
  Administered 2012-07-04: 10 mg via ORAL
  Filled 2012-07-04: qty 1

## 2012-07-04 NOTE — ED Provider Notes (Signed)
History     CSN: [id number]  Arrival date & time 07/04/12  1739   First MD Initiated Contact with Patient 07/04/12 1935      Chief Complaint  Patient presents with  . Chest Pain   HPI Mitchell Romero is a 27 y.o. male with a history of asthma complains of chest pain and shortness of breath worse with movement x2 days, specifically it started yesterday afternoon. Patient has no history of coronary artery disease, no history of early CAD in family or sudden cardiac death, no personal history of venous thromboembolic disease, no hemoptysis, no recent immobilization, no recent injuries.  Patient does have a history of allergies and he says his allergies have been acting up lately, he has had a cough, nonproductive, he says he is blowing his nose, constantly.  He says he also has itchy watery eyes frequently.  His chest pain is in the left side of his chest, it is sharp, intermittent, moderate, not associated with nausea vomiting, diaphoresis, dizziness. No fevers or chills.   Past Medical History  Diagnosis Date  . Asthma     History reviewed. No pertinent past surgical history.  History reviewed. No pertinent family history.  History  Substance Use Topics  . Smoking status: Current Every Day Smoker -- 0.50 packs/day    Types: Cigarettes  . Smokeless tobacco: Never Used  . Alcohol Use: Yes     Comment: occasional      Review of Systems At least 10pt or greater review of systems completed and are negative except where specified in the HPI.  Allergies  Peanut-containing drug products and Powder  Home Medications   Current Outpatient Rx  Name  Route  Sig  Dispense  Refill  . albuterol (PROVENTIL HFA;VENTOLIN HFA) 108 (90 BASE) MCG/ACT inhaler   Inhalation   Inhale 2 puffs into the lungs every 6 (six) hours as needed for wheezing.         Marland Kitchen oxyCODONE-acetaminophen (PERCOCET/ROXICET) 5-325 MG per tablet   Oral   Take 1-2 tablets by mouth every 6 (six) hours as needed for  pain.   10 tablet   0   . promethazine (PHENERGAN) 25 MG tablet   Oral   Take 1 tablet (25 mg total) by mouth every 6 (six) hours as needed for nausea.   10 tablet   0   . loratadine (CLARITIN) 10 MG tablet   Oral   Take 1 tablet (10 mg total) by mouth daily.   30 tablet   0     BP 119/67  Pulse 76  Temp(Src) 98.8 F (37.1 C) (Oral)  Resp 20  SpO2 100%  Physical Exam  Nursing notes reviewed.  Electronic medical record reviewed. VITAL SIGNS:   Filed Vitals:   07/04/12 1745 07/04/12 1934 07/04/12 2007 07/04/12 2015  BP: 109/90 115/59  119/67  Pulse: 94 70  76  Temp: 98.8 F (37.1 C)     TempSrc: Oral     Resp: 18 14  20   SpO2: 97% 100% 100% 100%   CONSTITUTIONAL: Awake, oriented, appears non-toxic HENT: Atraumatic, normocephalic, oral mucosa pink and moist, airway patent. Nares patent with yellow discharge, turbinates are boggy. Pharynx is mildly erythematous without exudate. External ears normal. EYES: Conjunctiva clear, EOMI, PERRLA NECK: Trachea midline, non-tender, supple CARDIOVASCULAR: Normal heart rate, Normal rhythm, No murmurs, rubs, gallops PULMONARY/CHEST: Occasional wheezes. No rhonchi, or rales. Symmetrical breath sounds. Non-tender. ABDOMINAL: Non-distended, soft, non-tender - no rebound or guarding.  BS normal.  NEUROLOGIC: Non-focal, moving all four extremities, no gross sensory or motor deficits. EXTREMITIES: No clubbing, cyanosis, or edema SKIN: Warm, Dry, No erythema, No rash  ED Course  Procedures (including critical care time)  Date: 07/04/2012  Rate: 95  Rhythm: normal sinus rhythm  QRS Axis: normal  Intervals: normal  ST/T Wave abnormalities: Early repolarization  Conduction Disutrbances: none  Narrative Interpretation: Nonischemic EKG, early repolarization abnormality, this is also noted on prior EKG dated 01/23/2011   Labs Reviewed  CBC - Abnormal; Notable for the following:    MCHC 36.9 (*)    All other components within normal  limits  BASIC METABOLIC PANEL - Abnormal; Notable for the following:    Glucose, Bld 103 (*)    All other components within normal limits  POCT I-STAT TROPONIN I   Dg Chest 2 View  07/04/2012  *RADIOLOGY REPORT*  Clinical Data: Left-sided chest pain  CHEST - 2 VIEW  Comparison: 04/02/2012  Findings: The cardiomediastinal silhouette is unremarkable. The lungs are clear. There is no evidence of focal airspace disease, pulmonary edema, suspicious pulmonary nodule/mass, pleural effusion, or pneumothorax. No acute bony abnormalities are identified.  IMPRESSION: No evidence of active cardiopulmonary disease.   Original Report Authenticated By: Harmon Pier, M.D.      1. Chest wall pain   2. Seasonal allergic rhinitis   3. Asthma       MDM  Labs obtained via triaged protocol-they're all unremarkable and within normal limits, patient is talking on the phone for see him, he is nontoxic.  Chest pain is nonreproducible, a sharp, it is atypical of acute coronary syndrome, EKG is unremarkable, he is PERC negative.  Think his chest pain is likely secondary to chest wall pain, he does admit to smoking marijuana infrequently, due to his cough and symptoms secondary to his allergies I think this is likely where his pain is coming from. Treat with Claritin, give him an albuterol inhaler.  Patient also very interested in a work note, provided.          Jones Skene, MD 07/04/12 2355

## 2012-07-04 NOTE — ED Notes (Signed)
Pt c/o left sided CP and SOB worse with movement x 2 days

## 2012-07-04 NOTE — ED Notes (Signed)
MD at bedside. 

## 2012-10-01 ENCOUNTER — Encounter (HOSPITAL_COMMUNITY): Payer: Self-pay | Admitting: *Deleted

## 2012-10-01 ENCOUNTER — Emergency Department (INDEPENDENT_AMBULATORY_CARE_PROVIDER_SITE_OTHER)
Admission: EM | Admit: 2012-10-01 | Discharge: 2012-10-01 | Disposition: A | Discharge status: Home / Self Care | Payer: Self-pay | Source: Home / Self Care | Attending: Emergency Medicine | Admitting: Emergency Medicine

## 2012-10-01 DIAGNOSIS — R51 Headache: Secondary | ICD-10-CM

## 2012-10-01 DIAGNOSIS — G43909 Migraine, unspecified, not intractable, without status migrainosus: Secondary | ICD-10-CM

## 2012-10-01 HISTORY — DX: Essential (primary) hypertension: I10

## 2012-10-01 MED ORDER — ONDANSETRON 4 MG PO TBDP
ORAL_TABLET | ORAL | Status: AC
  Filled 2012-10-01: qty 1

## 2012-10-01 MED ORDER — ONDANSETRON 4 MG PO TBDP
4.0000 mg | ORAL_TABLET | Freq: Once | ORAL | Status: AC
  Administered 2012-10-01: 4 mg via ORAL

## 2012-10-01 MED ORDER — ONDANSETRON HCL 4 MG PO TABS: 4.0000 mg | ORAL_TABLET | Freq: Three times a day (TID) | ORAL | Status: DC | PRN

## 2012-10-01 MED ORDER — KETOROLAC TROMETHAMINE 30 MG/ML IJ SOLN
INTRAMUSCULAR | Status: AC
  Filled 2012-10-01: qty 1

## 2012-10-01 MED ORDER — KETOROLAC TROMETHAMINE 30 MG/ML IJ SOLN
30.0000 mg | Freq: Once | INTRAMUSCULAR | Status: AC
  Administered 2012-10-01: 30 mg via INTRAMUSCULAR

## 2012-10-01 MED ORDER — OXYCODONE-ACETAMINOPHEN 5-325 MG PO TABS: 1.0000 | ORAL_TABLET | ORAL | Status: DC | PRN

## 2012-10-01 NOTE — ED Provider Notes (Signed)
CSN: [id number]     Arrival date & time 10/01/12  1828 History     First MD Initiated Contact with Patient 10/01/12 1839     Chief Complaint  Patient presents with  . Headache  . Nausea   (Consider location/radiation/quality/duration/timing/severity/associated sxs/prior Treatment) Patient is a 27 y.o. male presenting with headaches. The history is provided by the patient.  Headache Pain location:  Generalized Quality: throbbing. Radiates to:  Does not radiate Severity currently:  8/10 Severity at highest:  10/10 (worst last night before bed) Onset quality:  Sudden Duration:  3 days Timing:  Constant (Typically there, but may have 69min-1hour break ) Progression:  Worsening Chronicity:  Recurrent Similar to prior headaches: no (typically has migraines on left side, but this is diffuse)   Context: activity and loud noise   Context: not exposure to bright light   Relieved by:  NSAIDs Associated symptoms: dizziness, fatigue, nausea, swollen glands and URI   Associated symptoms: no abdominal pain, no back pain, no fever, no hearing loss, no neck stiffness, no numbness and no vomiting     Past Medical History  Diagnosis Date  . Asthma   . Hypertension     PCP took off HTN meds   History reviewed. No pertinent past surgical history. No family history on file. History  Substance Use Topics  . Smoking status: Current Every Day Smoker    Types: Cigarettes  . Smokeless tobacco: Never Used  . Alcohol Use: Yes     Comment: occasional    Review of Systems  Constitutional: Positive for chills and fatigue. Negative for fever.  HENT: Positive for rhinorrhea. Negative for hearing loss and neck stiffness.   Respiratory: Negative for chest tightness and shortness of breath.   Cardiovascular: Negative for chest pain.  Gastrointestinal: Positive for nausea. Negative for vomiting and abdominal pain.       Decreased appetite  Genitourinary: Negative for difficulty urinating.   Musculoskeletal: Negative for back pain.  Skin: Negative for rash.  Neurological: Positive for dizziness, weakness and headaches. Negative for speech difficulty and numbness.    Allergies  Peanut-containing drug products and Powder  Home Medications   Current Outpatient Rx  Name  Route  Sig  Dispense  Refill  . albuterol (PROVENTIL HFA;VENTOLIN HFA) 108 (90 BASE) MCG/ACT inhaler   Inhalation   Inhale 2 puffs into the lungs every 6 (six) hours as needed for wheezing.         Marland Kitchen loratadine (CLARITIN) 10 MG tablet   Oral   Take 1 tablet (10 mg total) by mouth daily.   30 tablet   0   . ondansetron (ZOFRAN) 4 MG tablet   Oral   Take 1 tablet (4 mg total) by mouth every 8 (eight) hours as needed for nausea.   10 tablet   0   . oxyCODONE-acetaminophen (PERCOCET/ROXICET) 5-325 MG per tablet   Oral   Take 1 tablet by mouth every 4 (four) hours as needed for pain.   20 tablet   0   . promethazine (PHENERGAN) 25 MG tablet   Oral   Take 1 tablet (25 mg total) by mouth every 6 (six) hours as needed for nausea.   10 tablet   0    BP 122/86  Pulse 85  Temp(Src) 100.4 F (38 C) (Oral)  SpO2 100% Physical Exam  Constitutional: He is oriented to person, place, and time. He appears well-developed and well-nourished. No distress.  HENT:  Head: Normocephalic and atraumatic.  Mouth/Throat: Oropharynx is clear and moist. No oropharyngeal exudate.  Broken molar on left bottom without surrounding erythema or signs of infection  Neck: Normal range of motion. Neck supple.  Cardiovascular: Normal rate, regular rhythm and normal heart sounds.   Pulmonary/Chest: Effort normal and breath sounds normal. He has no wheezes.  Abdominal: Soft. There is no tenderness.  Musculoskeletal: Normal range of motion. He exhibits no edema and no tenderness.  Lymphadenopathy:    He has no cervical adenopathy.  Neurological: He is alert and oriented to person, place, and time. No cranial nerve  deficit. He exhibits normal muscle tone. Coordination normal.  Strength within normal limits. Neuro exam grossly intact.  Skin: Skin is warm and dry. No rash noted. He is not diaphoretic.    ED Course   Procedures (including critical care time)  Labs Reviewed  CULTURE, GROUP A STREP  POCT RAPID STREP A ([facility] ONLY)   No results found. 1. Headache     MDM  Patient without any focal neurologic findings.  Strep negative. Most likely has viral syndrome causing fatigue, low grade fevers and headaches. Given Zofran here, as well as Rx for Percocet and Zofran. Should drink plenty of fluids and rest for next 24 hours. (Work note given) Patient encouraged to [facility] for follow up and to establish care. They will be able to schedule dental appointment for him there as well. Return to [facility] if headache gets acutely worse in next 24-48 hours. Patient agrees with plan  Hilarie Fredrickson, MD 10/01/12 [phone]

## 2012-10-01 NOTE — ED Notes (Signed)
C/O constant HA x 3 days without photosensitivity.  Has had intermittent nausea x 3 days, but is constant today.  Denies vomiting.  Unsure if fevers at home.  Denies any neck pain or abd pain.  When asked if sore throat, states, "not sore, but it feels like my tonsils are swollen".  Has been taking Advil and resting.

## 2012-10-02 NOTE — ED Provider Notes (Signed)
Medical screening examination/treatment/procedure(s) were performed by resident-physician practitioner and as supervising physician I was immediately available for consultation/collaboration.  Raynald Blend, MD 10/02/12 [phone]

## 2012-10-03 LAB — CULTURE, GROUP A STREP

## 2012-10-29 ENCOUNTER — Emergency Department (HOSPITAL_COMMUNITY)
Admission: EM | Admit: 2012-10-29 | Discharge: 2012-10-29 | Disposition: A | Discharge status: Home / Self Care | Payer: Self-pay | Attending: Emergency Medicine | Admitting: Emergency Medicine

## 2012-10-29 ENCOUNTER — Encounter (HOSPITAL_COMMUNITY): Payer: Self-pay | Admitting: *Deleted

## 2012-10-29 DIAGNOSIS — Z79899 Other long term (current) drug therapy: Secondary | ICD-10-CM | POA: Insufficient documentation

## 2012-10-29 DIAGNOSIS — J45909 Unspecified asthma, uncomplicated: Secondary | ICD-10-CM | POA: Insufficient documentation

## 2012-10-29 DIAGNOSIS — F17200 Nicotine dependence, unspecified, uncomplicated: Secondary | ICD-10-CM | POA: Insufficient documentation

## 2012-10-29 DIAGNOSIS — K029 Dental caries, unspecified: Secondary | ICD-10-CM | POA: Insufficient documentation

## 2012-10-29 DIAGNOSIS — K0889 Other specified disorders of teeth and supporting structures: Secondary | ICD-10-CM

## 2012-10-29 DIAGNOSIS — Z792 Long term (current) use of antibiotics: Secondary | ICD-10-CM | POA: Insufficient documentation

## 2012-10-29 DIAGNOSIS — R51 Headache: Secondary | ICD-10-CM | POA: Insufficient documentation

## 2012-10-29 DIAGNOSIS — K089 Disorder of teeth and supporting structures, unspecified: Secondary | ICD-10-CM | POA: Insufficient documentation

## 2012-10-29 DIAGNOSIS — I10 Essential (primary) hypertension: Secondary | ICD-10-CM | POA: Insufficient documentation

## 2012-10-29 MED ORDER — AMOXICILLIN 500 MG PO CAPS: 500.0000 mg | ORAL_CAPSULE | Freq: Three times a day (TID) | ORAL | Status: DC

## 2012-10-29 MED ORDER — TRAMADOL HCL 50 MG PO TABS: 50.0000 mg | ORAL_TABLET | Freq: Four times a day (QID) | ORAL | Status: DC | PRN

## 2012-10-29 MED ORDER — TRAMADOL HCL 50 MG PO TABS
50.0000 mg | ORAL_TABLET | Freq: Once | ORAL | Status: AC
  Administered 2012-10-29: 50 mg via ORAL
  Filled 2012-10-29: qty 1

## 2012-10-29 NOTE — ED Provider Notes (Signed)
CSN: [id number]     Arrival date & time 10/29/12  1303 History   First MD Initiated Contact with Patient 10/29/12 1312     Chief Complaint  Patient presents with  . Dental Pain   (Consider location/radiation/quality/duration/timing/severity/associated sxs/prior Treatment) HPI Comments: Patient is a 27 year old male presented to him at department for bilateral lower dental pain has been getting progressively worse over time. Patient states his pain is significantly worse being 2 days ago. He describes it as a severe sharp throbbing pain with radiation to left ear with associated generalized headache. Patient states he is having some drainage from the right lower tooth. Warm compresses alleviate pain while cool liquids aggravate pain. Patient denies any difficulty breathing, sore throat, drooling, trismus, fever, chills, facial swelling. Patient does not have a dentist.  Patient is a 27 y.o. male presenting with tooth pain.  Dental Pain Associated symptoms: headaches   Associated symptoms: no congestion, no drooling, no facial swelling, no fever, no neck pain and no oral lesions     Past Medical History  Diagnosis Date  . Asthma   . Hypertension     PCP took off HTN meds   No past surgical history on file. No family history on file. History  Substance Use Topics  . Smoking status: Current Every Day Smoker    Types: Cigarettes  . Smokeless tobacco: Never Used  . Alcohol Use: Yes     Comment: occasional    Review of Systems  Constitutional: Negative for fever and chills.  HENT: Positive for dental problem. Negative for hearing loss, nosebleeds, congestion, sore throat, facial swelling, rhinorrhea, sneezing, drooling, mouth sores, trouble swallowing, neck pain, neck stiffness, voice change, postnasal drip, sinus pressure, tinnitus and ear discharge.   Neurological: Positive for headaches.    Allergies  Peanut-containing drug products and Powder  Home Medications   Current  Outpatient Rx  Name  Route  Sig  Dispense  Refill  . albuterol (PROVENTIL HFA;VENTOLIN HFA) 108 (90 BASE) MCG/ACT inhaler   Inhalation   Inhale 2 puffs into the lungs every 6 (six) hours as needed for wheezing.         . hydrocortisone cream 1 %   Topical   Apply 1 application topically 2 (two) times daily.         Marland Kitchen ibuprofen (ADVIL,MOTRIN) 200 MG tablet   Oral   Take 400 mg by mouth every 6 (six) hours as needed for pain.         . naphazoline-glycerin (CLEAR EYES) 0.012-0.2 % SOLN   Both Eyes   Place 2 drops into both eyes daily as needed (for dry eyes).         Marland Kitchen oxyCODONE-acetaminophen (PERCOCET/ROXICET) 5-325 MG per tablet   Oral   Take 1 tablet by mouth every 4 (four) hours as needed for pain.   20 tablet   0   . amoxicillin (AMOXIL) 500 MG capsule   Oral   Take 1 capsule (500 mg total) by mouth 3 (three) times daily.   21 capsule   0   . traMADol (ULTRAM) 50 MG tablet   Oral   Take 1 tablet (50 mg total) by mouth every 6 (six) hours as needed for pain.   15 tablet   0    BP 123/65  Pulse 94  Temp(Src) 98.6 F (37 C) (Oral)  Resp 18  SpO2 98% Physical Exam  Constitutional: He is oriented to person, place, and time. He appears well-developed and well-nourished.  No distress.  HENT:  Head: Normocephalic and atraumatic.  Right Ear: Tympanic membrane and external ear normal.  Left Ear: Tympanic membrane and external ear normal.  Nose: Nose normal.  Mouth/Throat: Uvula is midline and mucous membranes are normal. No trismus in the jaw. Abnormal dentition. Dental caries present. No dental abscesses or edematous.    Eyes: Conjunctivae are normal.  Neck: Neck supple.  Neurological: He is alert and oriented to person, place, and time. No cranial nerve deficit.  Skin: Skin is warm and dry. He is not diaphoretic.  Psychiatric: He has a normal mood and affect.    ED Course  Procedures (including critical care time) Labs Review Labs Reviewed - No data to  display Imaging Review No results found.  MDM   1. Pain, dental     Afebrile, NAD, non-toxic appearing, AAOx4.  Patient with toothache.  No gross abscess.  Exam unconcerning for Ludwig's angina or spread of infection.  Will treat with amoxicillin and pain medicine.  Urged patient to follow-up with dentist. Patient is agreeable to plan. Patient is stable at time of discharge      Jeannetta Ellis, PA-C 10/29/12 1422

## 2012-10-29 NOTE — ED Notes (Signed)
Pt has bilateral lower dental pain and states pus coming out on right side

## 2012-11-01 NOTE — ED Provider Notes (Signed)
Medical screening examination/treatment/procedure(s) were performed by non-physician practitioner and as supervising physician I was immediately available for consultation/collaboration.   Rosali Augello J. Dorisann Schwanke, MD 11/01/12 1604 

## 2013-12-29 ENCOUNTER — Encounter (HOSPITAL_COMMUNITY): Payer: Self-pay | Admitting: Emergency Medicine

## 2013-12-29 ENCOUNTER — Emergency Department (HOSPITAL_COMMUNITY)
Admission: EM | Admit: 2013-12-29 | Discharge: 2013-12-29 | Disposition: A | Discharge status: Home / Self Care | Payer: Self-pay | Attending: Emergency Medicine | Admitting: Emergency Medicine

## 2013-12-29 DIAGNOSIS — R05 Cough: Secondary | ICD-10-CM | POA: Insufficient documentation

## 2013-12-29 DIAGNOSIS — Z792 Long term (current) use of antibiotics: Secondary | ICD-10-CM | POA: Insufficient documentation

## 2013-12-29 DIAGNOSIS — I10 Essential (primary) hypertension: Secondary | ICD-10-CM | POA: Insufficient documentation

## 2013-12-29 DIAGNOSIS — R110 Nausea: Secondary | ICD-10-CM | POA: Insufficient documentation

## 2013-12-29 DIAGNOSIS — Z79899 Other long term (current) drug therapy: Secondary | ICD-10-CM | POA: Insufficient documentation

## 2013-12-29 DIAGNOSIS — J029 Acute pharyngitis, unspecified: Secondary | ICD-10-CM | POA: Insufficient documentation

## 2013-12-29 DIAGNOSIS — Z7951 Long term (current) use of inhaled steroids: Secondary | ICD-10-CM | POA: Insufficient documentation

## 2013-12-29 DIAGNOSIS — Z720 Tobacco use: Secondary | ICD-10-CM | POA: Insufficient documentation

## 2013-12-29 DIAGNOSIS — J45909 Unspecified asthma, uncomplicated: Secondary | ICD-10-CM | POA: Insufficient documentation

## 2013-12-29 LAB — RAPID STREP SCREEN (MED CTR MEBANE ONLY): STREPTOCOCCUS, GROUP A SCREEN (DIRECT): NEGATIVE

## 2013-12-29 NOTE — ED Notes (Signed)
Pt c/o throat discomfort, redness, swelling, and bumps x4-5days. Reported he was test yesterday for STDs.  Stated that entire mouth burn while eating. Denies pain at this time.  Pt able to talk. NAD.

## 2013-12-29 NOTE — ED Provider Notes (Signed)
CSN: [id number]     Arrival date & time 12/29/13  1131 History  This chart was scribed for non-physician practitioner, Hyman Bible, PA-C, working with Pamella Pert, MD, by Delphia Grates, ED Scribe. This patient was seen in room WTR8/WTR8 and the patient's care was started at 12:10 PM.     Chief Complaint  Patient presents with  . Oral Swelling    The history is provided by the patient. No language interpreter was used.     HPI Comments: Mitchell Romero is a 28 y.o. male who presents to the Emergency Department complaining of sore throat onset 4 days ago. Patient states he is unsure of the cause of his pain/discomfort, but notes "bumps" and swelling in his throat. He also reports that whenever he eats, he feels a burning sensation throughout his mouth. There is associated non-productive cough and nausea. He has not taken anything for relief of symptoms. No sick contacts. Patient denies fever, congestion, rhinorrhea, or vomiting.   Past Medical History  Diagnosis Date  . Asthma   . Hypertension     PCP took off HTN meds   History reviewed. No pertinent past surgical history. History reviewed. No pertinent family history. History  Substance Use Topics  . Smoking status: Current Every Day Smoker    Types: Cigarettes  . Smokeless tobacco: Never Used  . Alcohol Use: Yes     Comment: occasional    Review of Systems  Constitutional: Negative for fever.  HENT: Positive for sore throat. Negative for congestion and rhinorrhea.   Respiratory: Positive for cough.   Gastrointestinal: Positive for nausea. Negative for vomiting.      Allergies  Peanut-containing drug products and Powder  Home Medications   Prior to Admission medications   Medication Sig Start Date End Date Taking? Authorizing Provider  albuterol (PROVENTIL HFA;VENTOLIN HFA) 108 (90 BASE) MCG/ACT inhaler Inhale 2 puffs into the lungs every 6 (six) hours as needed for wheezing.    Historical Provider, MD   amoxicillin (AMOXIL) 500 MG capsule Take 1 capsule (500 mg total) by mouth 3 (three) times daily. 10/29/12   Jennifer L Piepenbrink, PA-C  hydrocortisone cream 1 % Apply 1 application topically 2 (two) times daily.    Historical Provider, MD  ibuprofen (ADVIL,MOTRIN) 200 MG tablet Take 400 mg by mouth every 6 (six) hours as needed for pain.    Historical Provider, MD  naphazoline-glycerin (CLEAR EYES) 0.012-0.2 % SOLN Place 2 drops into both eyes daily as needed (for dry eyes).    Historical Provider, MD  oxyCODONE-acetaminophen (PERCOCET/ROXICET) 5-325 MG per tablet Take 1 tablet by mouth every 4 (four) hours as needed for pain. 10/01/12   Amber Fidel Levy, MD  traMADol (ULTRAM) 50 MG tablet Take 1 tablet (50 mg total) by mouth every 6 (six) hours as needed for pain. 10/29/12   Stephani Police Piepenbrink, PA-C   Triage Vitals: BP 116/65 mmHg  Pulse 94  Temp(Src) 98.2 F (36.8 C) (Oral)  Resp 16  SpO2 98%  Physical Exam  Constitutional: He is oriented to person, place, and time. He appears well-developed and well-nourished. No distress.  HENT:  Head: Normocephalic and atraumatic.  Right Ear: Tympanic membrane and ear canal normal.  Left Ear: Tympanic membrane and ear canal normal.  Mouth/Throat: No oral lesions. No trismus in the jaw. Posterior oropharyngeal erythema present. No oropharyngeal exudate.  Normal voice phonation. Handling secretions well. No oral lesions visualized. No sublingual tenderness or swelling.  Eyes: Conjunctivae and EOM are normal.  Neck: No tracheal deviation present.  Cardiovascular: Normal rate, regular rhythm and normal heart sounds.   Pulmonary/Chest: Effort normal and breath sounds normal. No respiratory distress.  Musculoskeletal: Normal range of motion.  Lymphadenopathy:    He has no cervical adenopathy.  No anterior cervical adenopathy.  Neurological: He is alert and oriented to person, place, and time.  Skin: Skin is warm and dry.  Psychiatric: He has a  normal mood and affect. His behavior is normal.  Nursing note and vitals reviewed.   ED Course  Procedures (including critical care time)  DIAGNOSTIC STUDIES: Oxygen Saturation is 98% on room air, normal by my interpretation.    COORDINATION OF CARE: At 1214 Discussed treatment plan with patient which includes labs. Patient agrees.   Labs Review Labs Reviewed  RAPID STREP SCREEN  CULTURE, GROUP A STREP   Imaging Review No results found.   EKG Interpretation None      MDM   Final diagnoses:  None   Patient presenting today with a sore throat.  Rapid strep is negative.  No signs of Peritonsillar Abscess or Retropharyngeal Abscess.  Patient handling secretions well.  Feel that the patient is stable for discharge.  Return precautions given.   Hyman Bible, PA-C 12/29/13 [place], MD 12/29/13 1537

## 2013-12-31 LAB — CULTURE, GROUP A STREP

## 2014-05-02 ENCOUNTER — Emergency Department (HOSPITAL_COMMUNITY)
Admission: EM | Admit: 2014-05-02 | Discharge: 2014-05-03 | Disposition: A | Discharge status: Home / Self Care | Payer: Self-pay | Attending: Emergency Medicine | Admitting: Emergency Medicine

## 2014-05-02 ENCOUNTER — Encounter (HOSPITAL_COMMUNITY): Payer: Self-pay

## 2014-05-02 DIAGNOSIS — Z720 Tobacco use: Secondary | ICD-10-CM | POA: Insufficient documentation

## 2014-05-02 DIAGNOSIS — K002 Abnormalities of size and form of teeth: Secondary | ICD-10-CM | POA: Insufficient documentation

## 2014-05-02 DIAGNOSIS — K088 Other specified disorders of teeth and supporting structures: Secondary | ICD-10-CM | POA: Insufficient documentation

## 2014-05-02 DIAGNOSIS — J45909 Unspecified asthma, uncomplicated: Secondary | ICD-10-CM | POA: Insufficient documentation

## 2014-05-02 DIAGNOSIS — K0889 Other specified disorders of teeth and supporting structures: Secondary | ICD-10-CM

## 2014-05-02 DIAGNOSIS — K029 Dental caries, unspecified: Secondary | ICD-10-CM | POA: Insufficient documentation

## 2014-05-02 DIAGNOSIS — I10 Essential (primary) hypertension: Secondary | ICD-10-CM | POA: Insufficient documentation

## 2014-05-02 DIAGNOSIS — Z91040 Latex allergy status: Secondary | ICD-10-CM | POA: Insufficient documentation

## 2014-05-02 MED ORDER — AMOXICILLIN 500 MG PO CAPS
500.0000 mg | ORAL_CAPSULE | Freq: Once | ORAL | Status: AC
  Administered 2014-05-02: 500 mg via ORAL
  Filled 2014-05-02: qty 1

## 2014-05-02 MED ORDER — AMOXICILLIN 500 MG PO CAPS: 500.0000 mg | ORAL_CAPSULE | Freq: Three times a day (TID) | ORAL | Status: DC

## 2014-05-02 MED ORDER — BUPIVACAINE-EPINEPHRINE (PF) 0.5% -1:200000 IJ SOLN
1.8000 mL | Freq: Once | INTRAMUSCULAR | Status: AC
  Administered 2014-05-02: 1.8 mL
  Filled 2014-05-02: qty 1.8

## 2014-05-02 MED ORDER — OXYCODONE-ACETAMINOPHEN 5-325 MG PO TABS: 1.0000 | ORAL_TABLET | ORAL | Status: DC | PRN

## 2014-05-02 NOTE — Discharge Instructions (Signed)
Dental Pain °A tooth ache may be caused by cavities (tooth decay). Cavities expose the nerve of the tooth to air and hot or cold temperatures. It may come from an infection or abscess (also called a boil or furuncle) around your tooth. It is also often caused by dental caries (tooth decay). This causes the pain you are having. °DIAGNOSIS  °Your caregiver can diagnose this problem by exam. °TREATMENT  °· If caused by an infection, it may be treated with medications which kill germs (antibiotics) and pain medications as prescribed by your caregiver. Take medications as directed. °· Only take over-the-counter or prescription medicines for pain, discomfort, or fever as directed by your caregiver. °· Whether the tooth ache today is caused by infection or dental disease, you should see your dentist as soon as possible for further care. °SEEK MEDICAL CARE IF: °The exam and treatment you received today has been provided on an emergency basis only. This is not a substitute for complete medical or dental care. If your problem worsens or new problems (symptoms) appear, and you are unable to meet with your dentist, call or return to this location. °SEEK IMMEDIATE MEDICAL CARE IF:  °· You have a fever. °· You develop redness and swelling of your face, jaw, or neck. °· You are unable to open your mouth. °· You have severe pain uncontrolled by pain medicine. °MAKE SURE YOU:  °· Understand these instructions. °· Will watch your condition. °· Will get help right away if you are not doing well or get worse. °Document Released: 02/08/2005 Document Revised: 05/03/2011 Document Reviewed: 09/27/2007 °ExitCare® Patient Information ©2015 [employer]. This information is not intended to replace advice given to you by your health care provider. Make sure you discuss any questions you have with your health care provider. ° °Emergency Department Resource Guide °1) Find a Doctor and Pay Out of Pocket °Although you won't have to find out who  is covered by your insurance plan, it is a good idea to ask around and get recommendations. You will then need to call the office and see if the doctor you have chosen will accept you as a new patient and what types of options they offer for patients who are self-pay. Some doctors offer discounts or will set up payment plans for their patients who do not have insurance, but you will need to ask so you aren't surprised when you get to your appointment. ° °2) Contact Your Local Health Department °Not all health departments have doctors that can see patients for sick visits, but many do, so it is worth a call to see if yours does. If you don't know where your local health department is, you can check in your phone book. The CDC also has a tool to help you locate your state's health department, and many state websites also have listings of all of their local health departments. ° °3) Find a Walk-in Clinic °If your illness is not likely to be very severe or complicated, you may want to try a walk in clinic. These are popping up all over the country in pharmacies, drugstores, and shopping centers. They're usually staffed by nurse practitioners or physician assistants that have been trained to treat common illnesses and complaints. They're usually fairly quick and inexpensive. However, if you have serious medical issues or chronic medical problems, these are probably not your best option. ° °No Primary Care Doctor: °- Call [facility] at  832-8000 - they can help you locate a primary   care doctor that  accepts your insurance, provides certain services, etc. °- Physician Referral Service- 1-800-533-3463 ° °Chronic Pain Problems: °Organization         Address  Phone   Notes  °[facility]  (336) 297-2271 Patients need to be referred by their primary care doctor.  ° °Medication Assistance: °Organization         Address  Phone   Notes  °[place] [facility] [address]  [address] °[place], [place] [address] (336) 641-8030 --Must be a resident of [place] °-- Must have NO insurance coverage whatsoever (no Medicaid/ Medicare, etc.) °-- The pt. MUST have a primary care doctor that directs their care regularly and follows them in the community °  °[facility]  (866) 331-1348   °[employer]  (888) 892-1162   ° °Agencies that provide inexpensive medical care: °Organization         Address  Phone   Notes  °[facility]  (336) 832-8035   °[facility]    (336) 832-7272   °[facility] [address] °[place], [place] [address] (336) 832-4777   °[facility] [address], °[place] (336) 271-4999   °[facility]    (336) 373-0678   °[facility]    (336) 272-1050   °[facility] ° [address], [place] Phone:  (336) 832-4444, Fax:  (336) 832-4440 Hours of Operation:  9 am - 6 pm, M-F.  Also accepts Medicaid/Medicare and self-pay.  °[facility] ° [address], [place] Phone: (336) 832-3150, Fax: (336) 832-3151. Hours of Operation:  8:30 am - 5:30 pm, M-F.  Also accepts Medicaid and self-pay.  °[facility] [place] [address], [place] Phone: (336) 878-6027   °[facility] [address], [place], [place] (336)723-1848, Ext. 123 Mondays & Thursdays: 7-9 AM.  First 15 patients are seen on a first come, first serve basis. °  ° °Medicaid-accepting [place] Providers: ° °Organization         Address  Phone   Notes  °[facility] [address], [place] (336) 641-2100 Also accepts self-pay patients.  °[facility] [address], [place] ° (336) 856-9996   °[facility] [address], [place] (336) 288-8857   °[facility] [address] [place] [address], [place] (336) 299-7000   °Veita Bland [address], [place]  ° (336) 373-1557 Only accepts [place] Access Medicaid patients after they have their name applied to their card.  ° °Self-Pay (no insurance) in [place]: ° °Organization         Address  Phone   Notes  °Sickle Cell Patients, [facility] [address], [place] (336) 832-1970   °[facility] [address], [place] (336) 832-4400   °[facility] ° [address], [place] (336) 992-4800   °[facility]/Dr. Osei-Bonsu ° [address] [place] [address], [place] or [address], [place] (336) 841-8500 Phone number for both [place] and [place] locations is the same.  °[facility] [address], [place] (336) 299-0000   °[facility] [address] [place] [address], [place] or [address] (336) 852-7530 °(336) 878-2260   °  [facility] [address], [place] (336) 350-1642, phone; (336) 294-5005, fax Sees patients 1st and 3rd Saturday of every month.  Must not qualify for public or private insurance (i.e. Medicaid, Medicare, [place] Health Choice, Veterans' Benefits) • Household income should be no more than 200% of the poverty level •The clinic cannot treat you if you are pregnant or think you are pregnant • Sexually transmitted diseases are not treated at the clinic.  ° ° °Dental Care: °Organization         Address  Phone  Notes  °[facility] [address], [place] (336) 641-6152 Accepts children up to age 21 who are enrolled in Medicaid or [place] Health Choice; pregnant women with a Medicaid card; and children who have applied for Medicaid or [place] Health Choice, but were declined, whose parents can pay a reduced fee at time of service.  °[facility] [place]  [address], [place] (336) 641-7733 Accepts children up to age 21 who are  enrolled in Medicaid or [place] Health Choice; pregnant women with a Medicaid card; and children who have applied for Medicaid or  [place] Health Choice, but were declined, whose parents can pay a reduced fee at time of service.  °[facility] ° [address], [place] (336) 641-4533 Patients are seen by appointment only. Walk-ins are not accepted. [facility] will see patients 18 years of age and older. °Monday - Tuesday (8am-5pm) °Most Wednesdays (8:30-5pm) °$30 per visit, cash only  °[facility] ° [address], [place] (336) 641-4533 Patients are seen by appointment only. Walk-ins are not accepted. [facility] will see patients 18 years of age and older. °One Wednesday Evening (Monthly: Volunteer Based).  $30 per visit, cash only  °[facility]  (919) 537-3737 for adults; Children under age 4, call Graduate Pediatric Dentistry at (919) [phone]. Children aged 4-14, please call (919) [phone] to request a pediatric application. ° Dental services are provided in all areas of dental care including fillings, crowns and bridges, complete and partial dentures, implants, gum treatment, root canals, and extractions. Preventive care is also provided. Treatment is provided to both adults and children. °Patients are selected via a lottery and there is often a waiting list. °  °[facility] [address], °[place] ° (336) 763-8833 [email or web address] °  °[facility] [address], [place], [place] (336)723-1848, Ext. 123 Second and Fourth Thursday of each month, opens at 6:30 AM; Clinic ends at 9 AM.  Patients are seen on a first-come first-served basis, and a limited number are seen during each clinic.  ° °[facility] ° [address], [place], [place] (336) 723-7904   Eligibility Requirements °You must have lived in [place], or [place] counties for at least the last three months. °  You  cannot be eligible for state or federal sponsored healthcare insurance, including Veterans Administration, Medicaid, or Medicare. °  You generally cannot be eligible for healthcare insurance through your employer.  °  How to apply: °Eligibility screenings are held every Tuesday and Wednesday afternoon from 1:00 pm until 4:00 pm. You do not need an appointment for the interview!  °[facility] [address], [place], [place] 336-631-2330   °[facility]  336-342-8273   °[facility]  336-703-3100   ° [facility]  336-570-6415   ° °Behavioral Health Resources in the Community: °Intensive Outpatient Programs °Organization         Address  Phone  Notes  °[facility] [address], [place], [place] 336-878-6098   °[facility] [address], [place], [place] 336-832-9800   °[facility] [address], [place], [place] ° 336-882-2125   °[facility] [address],  °[place], [place] 1-800-853-5163 or 336-641-4981   °Substance Abuse Resources °Organization         Address  Phone  Notes  °[facility]  336-882-2125   °[facility]  336-784-9470   °[facility]  336-285-9073   °[facility]  336-845-3988   °Residential & Outpatient Substance Abuse Program  1-800-659-3381   °Psychological Services °Organization         Address  Phone  Notes  °[facility]  336- 832-9600   °[facility]  336- 378-7881   °[facility] [address], [place] 1-800-853-5163 or 336-641-4981   ° °Mobile Crisis Teams °Organization         Address  Phone  Notes  °[facility], Mobile Crisis Care Unit  1-877-626-1772   °Assertive °[facility] ° [address] [place], [place] 336-834-9664   °Sharon DeEsch [address] °[place] 336-554-5454   ° °Self-Help/Support  Groups °Organization         Address  Phone             Notes  °[facility] [place] - variety of support groups  336- 373-1402 Call for more information  °Narcotics Anonymous (NA), [facility] [address], °[place]  2 meetings at this location  ° °Residential Treatment Programs °Organization         Address  Phone  Notes  °[facility] [address],    °[place]  1-866-801-8205   °[facility] ° [address], [place], [place] 704-293-8524   °[facility] [address], [place] 336-845-3988 Admissions: 8am-3pm M-F  °[facility] [address]    °[place], [place] 336-841-1104   °[facility] [address], [place], [place] 336-379-7146   °[facility] [address]  °[place], [place] 336-285-9073   °[facility] - Intensive Outpatient [address], , [place] 336-852-3033   °[facility] [address]  °[place], [place] 1-877-615-2722 or 336-784-9470   °[facility] [address] [place], [place] 336-227-7417 Accepts Medicaid  °[facility] [address]  ° [place] 1-800-659-3381 Substance Abuse/Addiction Treatment  ° °[place] Behavioral Health Resources °Organization         Address  Phone  Notes  °[facility]  (888) 581-9988   °Julie Brannon, PhD [address] [place], [place]   (336) 349-5553 or (336) 951-0000   °[facility]   [address] °[place], [place] (336) 349-4454   °[facility] [address], [place], [place] (336) 342-8316 Insurance/Medicaid/sponsorship through [facility]  °[facility] [address]                                    [place],  (336) 342-8316 Therapy/tele-psych/case  °[facility]   [address]  ° [place], [place] (336) 349-2233    °Dr. Arfeen  (336) 349-4544   °[facility] [place]  [employer] [facility] [place] [facility] 1) [address], [place] °2) [address], [place] °3)  [address], [place] (336) 349-3220 °(336) 342-7768 ° °(336) 342-8140   °[place] [facility] (336) 342-1394 or (336) 342-3537 (After Hours)    ° ° ° °

## 2014-05-02 NOTE — ED Notes (Signed)
Pt reports left lower dental pain that has been present for 1 week.  Airway intact.

## 2014-05-02 NOTE — ED Provider Notes (Signed)
CSN: [id number]     Arrival date & time 05/02/14  2136 History   First MD Initiated Contact with Patient 05/02/14 2303     Chief Complaint  Patient presents with  . Dental Pain    (Consider location/radiation/quality/duration/timing/severity/associated sxs/prior Treatment) Patient is a 29 y.o. male presenting with tooth pain. The history is provided by the patient. No language interpreter was used.  Dental Pain Location:  Lower Lower teeth location:  19/LL 1st molar Quality:  Sharp and throbbing Severity:  Moderate Onset quality:  Gradual Duration:  2 days Timing:  Constant Progression:  Worsening Chronicity:  New Context: dental caries, dental fracture and poor dentition   Context: not trauma   Relieved by:  Nothing Worsened by:  Cold food/drink (Exposure to cold air) Ineffective treatments:  NSAIDs and topical anesthetic gel Associated symptoms: gum swelling   Associated symptoms: no congestion, no difficulty swallowing, no drooling, no facial swelling, no fever, no oral bleeding, no oral lesions and no trismus   Risk factors: lack of dental care and smoking     Past Medical History  Diagnosis Date  . Asthma   . Hypertension     PCP took off HTN meds   History reviewed. No pertinent past surgical history. History reviewed. No pertinent family history. History  Substance Use Topics  . Smoking status: Current Every Day Smoker    Types: Cigarettes  . Smokeless tobacco: Never Used  . Alcohol Use: Yes     Comment: occasional    Review of Systems  Constitutional: Negative for fever.  HENT: Positive for dental problem. Negative for congestion, drooling, facial swelling and mouth sores.   All other systems reviewed and are negative.   Allergies  Peanut-containing drug products; Latex; and Powder  Home Medications   Prior to Admission medications   Medication Sig Start Date End Date Taking? Authorizing Provider  albuterol (PROVENTIL HFA;VENTOLIN HFA) 108 (90 BASE)  MCG/ACT inhaler Inhale 2 puffs into the lungs every 6 (six) hours as needed for wheezing.   Yes Historical Provider, MD  benzocaine (ORAJEL) 10 % mucosal gel Use as directed 1 application in the mouth or throat as needed for mouth pain.   Yes Historical Provider, MD  ibuprofen (ADVIL,MOTRIN) 200 MG tablet Take 400 mg by mouth every 6 (six) hours as needed for pain.   Yes Historical Provider, MD  amoxicillin (AMOXIL) 500 MG capsule Take 1 capsule (500 mg total) by mouth 3 (three) times daily. 05/02/14   Antonietta Breach, PA-C  oxyCODONE-acetaminophen (PERCOCET/ROXICET) 5-325 MG per tablet Take 1 tablet by mouth every 4 (four) hours as needed. 05/02/14   Antonietta Breach, PA-C  traMADol (ULTRAM) 50 MG tablet Take 1 tablet (50 mg total) by mouth every 6 (six) hours as needed for pain. Patient not taking: Reported on 05/02/2014 10/29/12   Anderson [place] Piepenbrink, PA-C   BP 138/77 mmHg  Pulse 88  Temp(Src) 98.5 F (36.9 C) (Oral)  Resp 18  Ht 5\' 7"  (1.702 m)  Wt 218 lb (98.884 kg)  BMI 34.14 kg/m2  SpO2 96%   Physical Exam  Constitutional: He is oriented to person, place, and time. He appears well-developed and well-nourished. No distress.  Nontoxic/nonseptic appearing  HENT:  Head: Normocephalic and atraumatic.  Mouth/Throat: Uvula is midline, oropharynx is clear and moist and mucous membranes are normal. No oral lesions. No trismus in the jaw. Abnormal dentition. Dental caries present. No uvula swelling.    Oropharynx clear. Patient tolerating secretions without difficulty.  Eyes: Conjunctivae and  EOM are normal. Pupils are equal, round, and reactive to light. No scleral icterus.  Neck: Normal range of motion.  No nuchal rigidity or meningismus  Pulmonary/Chest: Effort normal. No respiratory distress.  Respirations even and unlabored  Musculoskeletal: Normal range of motion.  Neurological: He is alert and oriented to person, place, and time. He exhibits normal muscle tone. Coordination normal.  Skin:  Skin is warm and dry. No rash noted. He is not diaphoretic. No erythema. No pallor.  Psychiatric: He has a normal mood and affect. His behavior is normal.  Nursing note and vitals reviewed.   ED Course  Dental Date/Time: 05/02/2014 11:31 PM Performed by: Antonietta Breach Authorized by: Antonietta Breach Consent: The procedure was performed in an emergent situation. Verbal consent obtained. Written consent not obtained. Risks and benefits: risks, benefits and alternatives were discussed Consent given by: patient Patient understanding: patient states understanding of the procedure being performed Patient consent: the patient's understanding of the procedure matches consent given Procedure consent: procedure consent matches procedure scheduled Relevant documents: relevant documents present and verified Test results: test results available and properly labeled Site marked: the operative site was marked Imaging studies: imaging studies available Required items: required blood products, implants, devices, and special equipment available Patient identity confirmed: verbally with patient and arm band Time out: Immediately prior to procedure a "time out" was called to verify the correct patient, procedure, equipment, support staff and site/side marked as required. Preparation: Patient was prepped and draped in the usual sterile fashion. Local anesthesia used: yes Anesthesia: local infiltration Local anesthetic: bupivacaine 0.5% with epinephrine Anesthetic total: 1.8 ml Patient sedated: no Patient tolerance: Patient tolerated the procedure well with no immediate complications Comments: Local infiltration of Marcaine to L lower 1st molar for pain control. Patient tolerating well with no immediate complications.   (including critical care time) Labs Review Labs Reviewed - No data to display  Imaging Review No results found.   EKG Interpretation None      MDM   Final diagnoses:  Dentalgia     Patient with toothache. No gross abscess. Exam unconcerning for Ludwig's angina or spread of infection. Will treat with Amoxicillin and pain medicine. Urged patient to follow-up with dentist. Return precautions and resource guide provided. Patient agreeable to plan with known address concerns.   Filed Vitals:   05/02/14 2207  BP: 138/77  Pulse: 88  Temp: 98.5 F (36.9 C)  TempSrc: Oral  Resp: 18  Height: 5\' 7"  (1.702 m)  Weight: 218 lb (98.884 kg)  SpO2: 96%       Antonietta Breach, PA-C 12/87/86 7672  David Glick, MD 09/47/09 6283

## 2014-05-03 MED ORDER — OXYCODONE-ACETAMINOPHEN 5-325 MG PO TABS
1.0000 | ORAL_TABLET | Freq: Once | ORAL | Status: AC
  Administered 2014-05-03: 1 via ORAL
  Filled 2014-05-03: qty 1

## 2014-08-13 ENCOUNTER — Encounter (HOSPITAL_COMMUNITY): Payer: Self-pay | Admitting: Emergency Medicine

## 2014-08-13 ENCOUNTER — Emergency Department (INDEPENDENT_AMBULATORY_CARE_PROVIDER_SITE_OTHER)
Admission: EM | Admit: 2014-08-13 | Discharge: 2014-08-13 | Disposition: A | Discharge status: Home / Self Care | Payer: Self-pay | Source: Home / Self Care | Attending: Family Medicine | Admitting: Family Medicine

## 2014-08-13 DIAGNOSIS — K529 Noninfective gastroenteritis and colitis, unspecified: Secondary | ICD-10-CM

## 2014-08-13 MED ORDER — ONDANSETRON 4 MG PO TBDP
4.0000 mg | ORAL_TABLET | Freq: Once | ORAL | Status: AC
  Administered 2014-08-13: 4 mg via ORAL

## 2014-08-13 MED ORDER — ONDANSETRON 4 MG PO TBDP
ORAL_TABLET | ORAL | Status: AC
  Filled 2014-08-13: qty 1

## 2014-08-13 MED ORDER — ONDANSETRON HCL 4 MG PO TABS: 4.0000 mg | ORAL_TABLET | Freq: Four times a day (QID) | ORAL | Status: DC

## 2014-08-13 NOTE — Discharge Instructions (Signed)
Clear liquid , bland diet tonight as tolerated, advance on wed as improved, use medicine as needed, imodium for diarrhea, return or see your doctor if any problems.

## 2014-08-13 NOTE — ED Notes (Signed)
Onset of symptoms last night.  C/o abdominal pain.  C/o vomiting and diarrhea

## 2014-08-13 NOTE — ED Provider Notes (Signed)
CSN: [id number]     Arrival date & time 08/13/14  1923 History   First MD Initiated Contact with Patient 08/13/14 1937     Chief Complaint  Patient presents with  . Abdominal Pain   (Consider location/radiation/quality/duration/timing/severity/associated sxs/prior Treatment) Patient is a 29 y.o. male presenting with abdominal pain. The history is provided by the patient.  Abdominal Pain Pain location:  Periumbilical Pain quality: cramping   Pain radiates to:  Does not radiate Pain severity:  Mild Onset quality:  Gradual Duration:  1 day Timing:  Intermittent Chronicity:  New Context: not sick contacts and not suspicious food intake   Worsened by:  Eating Associated symptoms: diarrhea and vomiting   Associated symptoms: no anorexia, no belching, no chills, no dysuria, no fever and no melena     Past Medical History  Diagnosis Date  . Asthma   . Hypertension     PCP took off HTN meds   History reviewed. No pertinent past surgical history. No family history on file. History  Substance Use Topics  . Smoking status: Current Every Day Smoker    Types: Cigarettes  . Smokeless tobacco: Never Used  . Alcohol Use: Yes     Comment: occasional    Review of Systems  Constitutional: Negative.  Negative for fever and chills.  HENT: Negative.   Gastrointestinal: Positive for vomiting, abdominal pain and diarrhea. Negative for blood in stool, melena, abdominal distention, anal bleeding, rectal pain and anorexia.  Genitourinary: Negative.  Negative for dysuria.    Allergies  Peanut-containing drug products; Latex; and Powder  Home Medications   Prior to Admission medications   Medication Sig Start Date End Date Taking? Authorizing Provider  albuterol (PROVENTIL HFA;VENTOLIN HFA) 108 (90 BASE) MCG/ACT inhaler Inhale 2 puffs into the lungs every 6 (six) hours as needed for wheezing.    Historical Provider, MD  amoxicillin (AMOXIL) 500 MG capsule Take 1 capsule (500 mg total) by  mouth 3 (three) times daily. 05/02/14   Antonietta Breach, PA-C  benzocaine (ORAJEL) 10 % mucosal gel Use as directed 1 application in the mouth or throat as needed for mouth pain.    Historical Provider, MD  ibuprofen (ADVIL,MOTRIN) 200 MG tablet Take 400 mg by mouth every 6 (six) hours as needed for pain.    Historical Provider, MD  ondansetron (ZOFRAN) 4 MG tablet Take 1 tablet (4 mg total) by mouth every 6 (six) hours. Prn n/v. 08/13/14   Billy Fischer, MD  oxyCODONE-acetaminophen (PERCOCET/ROXICET) 5-325 MG per tablet Take 1 tablet by mouth every 4 (four) hours as needed. 05/02/14   Antonietta Breach, PA-C  traMADol (ULTRAM) 50 MG tablet Take 1 tablet (50 mg total) by mouth every 6 (six) hours as needed for pain. Patient not taking: Reported on 05/02/2014 10/29/12   Baron Sane, PA-C   BP 117/69 mmHg  Pulse 79  Temp(Src) 98.8 F (37.1 C) (Oral)  Resp 12  SpO2 96% Physical Exam  Constitutional: He is oriented to person, place, and time. He appears well-developed and well-nourished. No distress.  HENT:  Mouth/Throat: Oropharynx is clear and moist.  Neck: Normal range of motion. Neck supple.  Abdominal: Soft. Bowel sounds are normal. He exhibits no distension and no mass. There is no tenderness. There is no rebound and no guarding.  Neurological: He is alert and oriented to person, place, and time.  Skin: Skin is warm. No rash noted.  Nursing note and vitals reviewed.   ED Course  Procedures (including critical care  time) Labs Review Labs Reviewed - No data to display  Imaging Review No results found.   MDM   1. Gastroenteritis, acute        Billy Fischer, MD 08/13/14 1958

## 2015-05-21 ENCOUNTER — Encounter (HOSPITAL_COMMUNITY): Payer: Self-pay | Admitting: *Deleted

## 2015-05-21 ENCOUNTER — Emergency Department (INDEPENDENT_AMBULATORY_CARE_PROVIDER_SITE_OTHER)
Admission: EM | Admit: 2015-05-21 | Discharge: 2015-05-21 | Disposition: A | Discharge status: Home / Self Care | Payer: Self-pay | Source: Home / Self Care | Attending: Family Medicine | Admitting: Family Medicine

## 2015-05-21 DIAGNOSIS — T85848A Pain due to other internal prosthetic devices, implants and grafts, initial encounter: Secondary | ICD-10-CM

## 2015-05-21 DIAGNOSIS — T148 Other injury of unspecified body region: Secondary | ICD-10-CM

## 2015-05-21 DIAGNOSIS — S76011A Strain of muscle, fascia and tendon of right hip, initial encounter: Secondary | ICD-10-CM

## 2015-05-21 DIAGNOSIS — T8584XA Pain due to internal prosthetic devices, implants and grafts, not elsewhere classified, initial encounter: Secondary | ICD-10-CM

## 2015-05-21 DIAGNOSIS — T148XXA Other injury of unspecified body region, initial encounter: Secondary | ICD-10-CM

## 2015-05-21 MED ORDER — AMOXICILLIN 500 MG PO CAPS: 1000.0000 mg | ORAL_CAPSULE | Freq: Two times a day (BID) | ORAL | Status: DC

## 2015-05-21 MED ORDER — HYDROCODONE-ACETAMINOPHEN 5-325 MG PO TABS: 1.0000 | ORAL_TABLET | ORAL | Status: DC | PRN

## 2015-05-21 MED ORDER — NAPROXEN 375 MG PO TABS: 375.0000 mg | ORAL_TABLET | Freq: Two times a day (BID) | ORAL | Status: DC

## 2015-05-21 NOTE — ED Provider Notes (Signed)
CSN: [id number]     Arrival date & time 05/21/15  1940 History   First MD Initiated Contact with Patient 05/21/15 2050     Chief Complaint  Patient presents with  . Hip Pain  . Dental Pain   (Consider location/radiation/quality/duration/timing/severity/associated sxs/prior Treatment) HPI Comments: 30 year old obese male complaining of right hip pain. States he woke up 3 days ago and discovered pain along the proximal most aspect of the anterior lateral hip. Hurts worse when he stands and then rotates, right hip. He states that his jobs and school requires him to stand for several hours during the day. Denies any known trauma. No blunt trauma and no fall or other injury.  Also complains of pain in the tooth of the right lower jaw. His first and second molars bilaterally are rotten and diluted to below the gumline. Associate gingiva with minor erythema and swelling. No abscess formation is seen.    Past Medical History  Diagnosis Date  . Asthma   . Hypertension     PCP took off HTN meds   History reviewed. No pertinent past surgical history. History reviewed. No pertinent family history. Social History  Substance Use Topics  . Smoking status: Current Every Day Smoker    Types: Cigarettes  . Smokeless tobacco: Never Used  . Alcohol Use: Yes     Comment: occasional    Review of Systems  Constitutional: Negative for fever, activity change and fatigue.  HENT: Positive for dental problem.   Respiratory: Negative.   Cardiovascular: Negative.   Gastrointestinal: Negative.  Negative for abdominal pain.  Genitourinary: Negative.   Musculoskeletal: Positive for arthralgias. Negative for joint swelling.  Skin: Negative.   Neurological: Negative.   All other systems reviewed and are negative.   Allergies  Peanut-containing drug products; Latex; and Powder  Home Medications   Prior to Admission medications   Medication Sig Start Date End Date Taking? Authorizing Provider   albuterol (PROVENTIL HFA;VENTOLIN HFA) 108 (90 BASE) MCG/ACT inhaler Inhale 2 puffs into the lungs every 6 (six) hours as needed for wheezing.    Historical Provider, MD  amoxicillin (AMOXIL) 500 MG capsule Take 2 capsules (1,000 mg total) by mouth 2 (two) times daily. 05/21/15   Janne Napoleon, NP  benzocaine (ORAJEL) 10 % mucosal gel Use as directed 1 application in the mouth or throat as needed for mouth pain.    Historical Provider, MD  HYDROcodone-acetaminophen (NORCO/VICODIN) 5-325 MG tablet Take 1 tablet by mouth every 4 (four) hours as needed. 05/21/15   Janne Napoleon, NP  ibuprofen (ADVIL,MOTRIN) 200 MG tablet Take 400 mg by mouth every 6 (six) hours as needed for pain.    Historical Provider, MD  naproxen (NAPROSYN) 375 MG tablet Take 1 tablet (375 mg total) by mouth 2 (two) times daily. 05/21/15   Janne Napoleon, NP   Meds Ordered and Administered this Visit  Medications - No data to display  BP 115/62 mmHg  Pulse 88  Temp(Src) 99.5 F (37.5 C) (Oral)  Resp 18  SpO2 100% No data found.   Physical Exam  Constitutional: He is oriented to person, place, and time. He appears well-developed. No distress.  HENT:  See history of present illness for tooth exam. No abscess formation seen. Poor dental repair. The remaining molars are donated with large central cavities and worn down to the gumline or below the gumline.  Eyes: EOM are normal.  Neck: Normal range of motion. Neck supple.  Cardiovascular: Normal rate.   Pulmonary/Chest: Effort  normal. No respiratory distress.  Musculoskeletal: Normal range of motion. He exhibits no edema.  Patient points to the right lateral was proximal aspect of the hip and pelvis and nearly inguinal crease as a source of pain in the hip. He is able to stand however when standing on the right lower extremity and rotating on the hip this reproduces the pain. In order to reproduce tenderness much weight and pressure has to be applied. Internal and external rotation to  the hip produced little pain to the anterior proximal hip. Internal rotation produced pain to the posterior proximal thigh causing pain to the hamstring muscle. No bony tenderness. No percussion tenderness. Full range of motion of the hip is intact. Distal neurovascular motor Sentry is intact.  Neurological: He is alert and oriented to person, place, and time. He exhibits normal muscle tone.  Skin: Skin is warm and dry.  Nursing note and vitals reviewed.   ED Course  Procedures (including critical care time)  Labs Review Labs Reviewed - No data to display  Imaging Review No results found.   Visual Acuity Review  Right Eye Distance:   Left Eye Distance:   Bilateral Distance:    Right Eye Near:   Left Eye Near:    Bilateral Near:         MDM   1. Hip strain, right, initial encounter   2. Muscle strain   3. Dental implant pain, initial encounter    Meds ordered this encounter  Medications  . naproxen (NAPROSYN) 375 MG tablet    Sig: Take 1 tablet (375 mg total) by mouth 2 (two) times daily.    Dispense:  20 tablet    Refill:  0    Order Specific Question:  Supervising Provider    Answer:  Billy Fischer [phone]  . amoxicillin (AMOXIL) 500 MG capsule    Sig: Take 2 capsules (1,000 mg total) by mouth 2 (two) times daily.    Dispense:  30 capsule    Refill:  0    Order Specific Question:  Supervising Provider    Answer:  Billy Fischer [phone]  . HYDROcodone-acetaminophen (NORCO/VICODIN) 5-325 MG tablet    Sig: Take 1 tablet by mouth every 4 (four) hours as needed.    Dispense:  12 tablet    Refill:  0    Order Specific Question:  Supervising Provider    Answer:  Billy Fischer [phone]   Must follow-up with dentist as soon as possible. Must follow-up with primary care doctor or hip. Heat to the hip and limit movement that exacerbates pain. Pain medicines as directed.    Janne Napoleon, NP 05/21/15 2113

## 2015-05-21 NOTE — ED Notes (Addendum)
Patient reports right hip pain x 4 days, reports history of tears to ligaments in the hip. Patient reports no injury this week to right hip. No numbness or tingling to extremity. Patient reports he also has right lower dental pain, reports broke tooth yesterday.

## 2015-05-21 NOTE — Discharge Instructions (Signed)
Muscle Strain °A muscle strain (pulled muscle) happens when a muscle is stretched beyond normal length. It happens when a sudden, violent force stretches your muscle too far. Usually, a few of the fibers in your muscle are torn. Muscle strain is common in athletes. Recovery usually takes 1-2 weeks. Complete healing takes 5-6 weeks.  °HOME CARE  °· Follow the PRICE method of treatment to help your injury get better. Do this the first 2-3 days after the injury: °¨ Protect. Protect the muscle to keep it from getting injured again. °¨ Rest. Limit your activity and rest the injured body part. °¨ Ice. Put ice in a plastic bag. Place a towel between your skin and the bag. Then, apply the ice and leave it on from 15-20 minutes each hour. After the third day, switch to moist heat packs. °¨ Compression. Use a splint or elastic bandage on the injured area for comfort. Do not put it on too tightly. °¨ Elevate. Keep the injured body part above the level of your heart. °· Only take medicine as told by your doctor. °· Warm up before doing exercise to prevent future muscle strains. °GET HELP IF:  °· You have more pain or puffiness (swelling) in the injured area. °· You feel numbness, tingling, or notice a loss of strength in the injured area. °MAKE SURE YOU:  °· Understand these instructions. °· Will watch your condition. °· Will get help right away if you are not doing well or get worse. °  °This information is not intended to replace advice given to you by your health care provider. Make sure you discuss any questions you have with your health care provider. °  °Document Released: 11/18/2007 Document Revised: 11/29/2012 Document Reviewed: 09/07/2012 °Elsevier Interactive Patient Education ©2016 [employer] ° °

## 2015-06-29 ENCOUNTER — Encounter (HOSPITAL_COMMUNITY): Payer: Self-pay | Admitting: *Deleted

## 2015-06-29 ENCOUNTER — Emergency Department (HOSPITAL_COMMUNITY)
Admission: EM | Admit: 2015-06-29 | Discharge: 2015-06-29 | Disposition: A | Discharge status: Home / Self Care | Payer: Self-pay | Attending: Emergency Medicine | Admitting: Emergency Medicine

## 2015-06-29 DIAGNOSIS — I10 Essential (primary) hypertension: Secondary | ICD-10-CM | POA: Insufficient documentation

## 2015-06-29 DIAGNOSIS — A64 Unspecified sexually transmitted disease: Secondary | ICD-10-CM | POA: Insufficient documentation

## 2015-06-29 DIAGNOSIS — Z79899 Other long term (current) drug therapy: Secondary | ICD-10-CM | POA: Insufficient documentation

## 2015-06-29 DIAGNOSIS — Z91040 Latex allergy status: Secondary | ICD-10-CM | POA: Insufficient documentation

## 2015-06-29 DIAGNOSIS — H1013 Acute atopic conjunctivitis, bilateral: Secondary | ICD-10-CM | POA: Insufficient documentation

## 2015-06-29 DIAGNOSIS — Z202 Contact with and (suspected) exposure to infections with a predominantly sexual mode of transmission: Secondary | ICD-10-CM

## 2015-06-29 DIAGNOSIS — J45909 Unspecified asthma, uncomplicated: Secondary | ICD-10-CM | POA: Insufficient documentation

## 2015-06-29 DIAGNOSIS — Z791 Long term (current) use of non-steroidal anti-inflammatories (NSAID): Secondary | ICD-10-CM | POA: Insufficient documentation

## 2015-06-29 LAB — URINALYSIS, ROUTINE W REFLEX MICROSCOPIC
Bilirubin Urine: NEGATIVE
Glucose, UA: NEGATIVE mg/dL
Hgb urine dipstick: NEGATIVE
Ketones, ur: NEGATIVE mg/dL
LEUKOCYTES UA: NEGATIVE
Nitrite: NEGATIVE
PROTEIN: NEGATIVE mg/dL
Specific Gravity, Urine: 1.02 (ref 1.005–1.030)
pH: 6 (ref 5.0–8.0)

## 2015-06-29 MED ORDER — LORATADINE 10 MG PO TABS
10.0000 mg | ORAL_TABLET | Freq: Once | ORAL | Status: AC
  Administered 2015-06-29: 10 mg via ORAL
  Filled 2015-06-29: qty 1

## 2015-06-29 MED ORDER — NAPHAZOLINE-PHENIRAMINE 0.025-0.3 % OP SOLN: 1.0000 [drp] | Freq: Four times a day (QID) | OPHTHALMIC | Status: DC | PRN

## 2015-06-29 MED ORDER — LORATADINE 10 MG PO TABS: 10.0000 mg | ORAL_TABLET | Freq: Every day | ORAL | Status: DC

## 2015-06-29 MED ORDER — TETRACAINE HCL 0.5 % OP SOLN
1.0000 [drp] | Freq: Once | OPHTHALMIC | Status: AC
  Administered 2015-06-29: 1 [drp] via OPHTHALMIC
  Filled 2015-06-29: qty 2

## 2015-06-29 MED ORDER — FLUORESCEIN SODIUM 1 MG OP STRP
1.0000 | ORAL_STRIP | Freq: Once | OPHTHALMIC | Status: AC
  Administered 2015-06-29: 1 via OPHTHALMIC
  Filled 2015-06-29: qty 1

## 2015-06-29 NOTE — ED Notes (Signed)
Declined W/C at D/C and was escorted to lobby by RN. 

## 2015-06-29 NOTE — ED Provider Notes (Signed)
CSN: [id number]     Arrival date & time 06/29/15  1234 History  By signing my name below, I, Eustaquio Maize, attest that this documentation has been prepared under the direction and in the presence of Harlene Ramus, PA-C. Electronically Signed: Eustaquio Maize, ED Scribe. 06/29/2015. 1:16 PM.   Chief Complaint  Patient presents with  . Eye Problem   The history is provided by the patient. No language interpreter was used.     HPI Comments: Mitchell Romero is a 30 y.o. male who presents to the Emergency Department complaining of gradual onset, constant, left eye redness and swelling that began yesterday. Pt reports that for the past 2 weeks he has been having bilateral itchy watery eyes, worse on the left, but did not begin having the swelling until yesterday morning. Pt applied OTC artifical tear eye drops yesterday without relief. He went to work and was told that he cannot come back to work until he is evaluated. Pt does mention that his left eye is crusted shut in the mornings. He also complains of a gritty sensation to the left eye, photophobia, rhinorrhea, congestion and dry cough. Pt does not wear contacts. Denies fever, ear pain, visual changes, CP, SOB, wheezing or any other associated symptoms.   Pt was sexually active with an HIV positive partner a few years ago and is concerned that he might be positive for HIV now. He has been checked in the past for HIV but has not been checked in the last year. Questionable penile discharge with intermittent episodes of dysuria. Pt would like to be tested while in the ED today. Denies fever, chills, rash, CP, SOB, abdominal pain, N/V/D, penile or testicular swelling, hematuria, rash to genitals, or any other associated symptoms.   Past Medical History  Diagnosis Date  . Asthma   . Hypertension     PCP took off HTN meds   History reviewed. No pertinent past surgical history. History reviewed. No pertinent family history. Social History  Substance Use  Topics  . Smoking status: Current Every Day Smoker    Types: Cigarettes  . Smokeless tobacco: Never Used  . Alcohol Use: Yes     Comment: occasional    Review of Systems  Constitutional: Negative for fever.  HENT: Positive for congestion and rhinorrhea. Negative for ear pain.   Eyes: Positive for photophobia, discharge (watery), redness and itching. Negative for visual disturbance.  Respiratory: Positive for cough.   Genitourinary: Positive for dysuria and discharge (questionable). Negative for hematuria, penile swelling, scrotal swelling, genital sores, penile pain and testicular pain.   Allergies  Peanut-containing drug products; Latex; and Powder  Home Medications   Prior to Admission medications   Medication Sig Start Date End Date Taking? Authorizing Provider  albuterol (PROVENTIL HFA;VENTOLIN HFA) 108 (90 BASE) MCG/ACT inhaler Inhale 2 puffs into the lungs every 6 (six) hours as needed for wheezing.    Historical Provider, MD  amoxicillin (AMOXIL) 500 MG capsule Take 2 capsules (1,000 mg total) by mouth 2 (two) times daily. 05/21/15   Janne Napoleon, NP  benzocaine (ORAJEL) 10 % mucosal gel Use as directed 1 application in the mouth or throat as needed for mouth pain.    Historical Provider, MD  HYDROcodone-acetaminophen (NORCO/VICODIN) 5-325 MG tablet Take 1 tablet by mouth every 4 (four) hours as needed. 05/21/15   Janne Napoleon, NP  ibuprofen (ADVIL,MOTRIN) 200 MG tablet Take 400 mg by mouth every 6 (six) hours as needed for pain.    Historical  Provider, MD  loratadine (CLARITIN) 10 MG tablet Take 1 tablet (10 mg total) by mouth daily. 06/29/15   Nona Dell, PA-C  naphazoline-pheniramine (NAPHCON-A) 0.025-0.3 % ophthalmic solution Place 1 drop into both eyes 4 (four) times daily as needed for irritation or allergies. 06/29/15   Nona Dell, PA-C  naproxen (NAPROSYN) 375 MG tablet Take 1 tablet (375 mg total) by mouth 2 (two) times daily. 05/21/15   Janne Napoleon, NP    BP 116/64 mmHg  Pulse 61  Temp(Src) 98.5 F (36.9 C) (Oral)  Resp 18  SpO2 100%   Physical Exam  Constitutional: He is oriented to person, place, and time. He appears well-developed and well-nourished.  HENT:  Head: Normocephalic and atraumatic.  Mouth/Throat: Uvula is midline, oropharynx is clear and moist and mucous membranes are normal. No oropharyngeal exudate, posterior oropharyngeal edema, posterior oropharyngeal erythema or tonsillar abscesses.  Eyes: Conjunctivae and EOM are normal. Pupils are equal, round, and reactive to light. Lids are everted and swept, no foreign bodies found. Right eye exhibits discharge (watery). Right eye exhibits no chemosis, no exudate and no hordeolum. No foreign body present in the right eye. Left eye exhibits discharge (watery). Left eye exhibits no chemosis, no exudate and no hordeolum. No foreign body present in the left eye. Right conjunctiva is not injected. Left conjunctiva is not injected. No scleral icterus.  Slit lamp exam:      The left eye shows no corneal abrasion, no corneal flare, no corneal ulcer, no foreign body, no hyphema, no hypopyon and no fluorescein uptake.  Cardiovascular: Normal rate, regular rhythm, normal heart sounds and intact distal pulses.   Pulmonary/Chest: Effort normal and breath sounds normal.  Abdominal: Soft. Bowel sounds are normal. There is no tenderness. Hernia confirmed negative in the right inguinal area and confirmed negative in the left inguinal area.  Genitourinary: Testes normal. Right testis shows no mass, no swelling and no tenderness. Left testis shows no mass, no swelling and no tenderness. Uncircumcised. No phimosis, paraphimosis, hypospadias, penile erythema or penile tenderness. No discharge found.  Musculoskeletal: He exhibits no edema.  Lymphadenopathy:       Right: No inguinal adenopathy present.       Left: No inguinal adenopathy present.  Neurological: He is alert and oriented to person, place,  and time.  Skin: Skin is warm and dry.  Nursing note and vitals reviewed.   ED Course  Procedures (including critical care time)  DIAGNOSTIC STUDIES: Oxygen Saturation is 100% on RA, normal by my interpretation.    COORDINATION OF CARE: 1:15 PM-Discussed treatment plan which includes fluorescein strip test, RPR, HIV antibody, and UA with pt at bedside and pt agreed to plan.   Labs Review Labs Reviewed  URINALYSIS, ROUTINE W REFLEX MICROSCOPIC (NOT AT [facility])  RPR  HIV ANTIBODY (ROUTINE TESTING)  GC/CHLAMYDIA PROBE AMP ([place]) NOT AT [facility]    Imaging Review No results found. I have personally reviewed and evaluated these lab results as part of my medical decision-making.   EKG Interpretation None      MDM   Final diagnoses:  STD exposure  Allergic conjunctivitis, bilateral   Patient presentation consistent with allergic conjunctivitis.  No purulent discharge, corneal abrasions, entrapment, consensual photophobia, or dendritic staining with fluorescein study.  Presentation non-concerning for iritis, bacterial conjunctivitis, corneal abrasions, or HSV.  No antibiotics are indicated and patient will be prescribed naphazoline for itching and d/c home with oral antihistamine.  Patient advised to followup with ophthalmologist if  symptoms persist or worsen in any way including vision change or purulent discharge.  Patient verbalizes understanding and is agreeable with discharge.   Patient is afebrile without abdominal tenderness, abdominal pain or painful bowel movements to indicate prostatitis.  UA negative. No tenderness to palpation of the testes or epididymis to suggest orchitis or epididymitis.  STD cultures obtained including HIV, syphilis, gonorrhea and chlamydia. Patient to be discharged with instructions to follow up with PCP. Discussed importance of using protection when sexually active. Pt understands that they have GC/Chlamydia cultures pending and that they will need to  inform all sexual partners if results return positive.   I personally performed the services described in this documentation, which was scribed in my presence. The recorded information has been reviewed and is accurate.      Chesley Noon Craig, [place] 06/29/15 [place], MD 07/02/15 [phone]

## 2015-06-29 NOTE — ED Notes (Signed)
Pt reportas eye worse on SAt. Eye problem started one week ago.

## 2015-06-29 NOTE — Discharge Instructions (Signed)
Use your eyedrops as prescribed as needed for itching of eyes. He may also continue using artificial tears that he may purchase over-the-counter. Take your antihistamine as prescribed as needed for relief of sneezing, nasal congestion and allergy symptoms. Follow-up with the ophthalmologist listed above in the next 2-3 days if your eye symptoms have not improved or have worsen.  1. Medications: usual home medications 2. Treatment: rest, drink plenty of fluids, use a condom with every sexual encounter 3. Follow Up: Please followup with your primary doctor in 3 days for discussion of your diagnoses and further evaluation after today's visit; if you do not have a primary care doctor use the resource guide provided to find one; Please return to the ER for worsening symptoms, high fevers or persistent vomiting.  You have been tested for HIV, syphilis, chlamydia and gonorrhea.  These results will be available in approximately 3 days.  Please inform all sexual partners if you test positive for any of these diseases.

## 2015-06-30 LAB — GC/CHLAMYDIA PROBE AMP (~~LOC~~) NOT AT ARMC
Chlamydia: NEGATIVE
NEISSERIA GONORRHEA: NEGATIVE

## 2015-06-30 LAB — RPR: RPR: NONREACTIVE

## 2015-06-30 LAB — HIV ANTIBODY (ROUTINE TESTING W REFLEX): HIV Screen 4th Generation wRfx: NONREACTIVE

## 2015-07-01 LAB — CERVICOVAGINAL ANCILLARY ONLY
CHLAMYDIA, DNA PROBE: NEGATIVE
Neisseria Gonorrhea: NEGATIVE

## 2015-11-29 ENCOUNTER — Emergency Department (HOSPITAL_COMMUNITY)
Admission: EM | Admit: 2015-11-29 | Discharge: 2015-11-30 | Disposition: A | Discharge status: Home / Self Care | Payer: Self-pay | Attending: Emergency Medicine | Admitting: Emergency Medicine

## 2015-11-29 ENCOUNTER — Emergency Department (HOSPITAL_COMMUNITY): Payer: Self-pay

## 2015-11-29 ENCOUNTER — Encounter (HOSPITAL_COMMUNITY): Payer: Self-pay | Admitting: Emergency Medicine

## 2015-11-29 DIAGNOSIS — I10 Essential (primary) hypertension: Secondary | ICD-10-CM | POA: Insufficient documentation

## 2015-11-29 DIAGNOSIS — Z91040 Latex allergy status: Secondary | ICD-10-CM | POA: Insufficient documentation

## 2015-11-29 DIAGNOSIS — M5432 Sciatica, left side: Secondary | ICD-10-CM | POA: Insufficient documentation

## 2015-11-29 DIAGNOSIS — Z91010 Allergy to peanuts: Secondary | ICD-10-CM | POA: Insufficient documentation

## 2015-11-29 DIAGNOSIS — F17210 Nicotine dependence, cigarettes, uncomplicated: Secondary | ICD-10-CM | POA: Insufficient documentation

## 2015-11-29 DIAGNOSIS — M5430 Sciatica, unspecified side: Secondary | ICD-10-CM

## 2015-11-29 DIAGNOSIS — R52 Pain, unspecified: Secondary | ICD-10-CM

## 2015-11-29 DIAGNOSIS — Z202 Contact with and (suspected) exposure to infections with a predominantly sexual mode of transmission: Secondary | ICD-10-CM | POA: Insufficient documentation

## 2015-11-29 DIAGNOSIS — J45909 Unspecified asthma, uncomplicated: Secondary | ICD-10-CM | POA: Insufficient documentation

## 2015-11-29 LAB — URINE MICROSCOPIC-ADD ON

## 2015-11-29 LAB — URINALYSIS, ROUTINE W REFLEX MICROSCOPIC
Glucose, UA: NEGATIVE mg/dL
KETONES UR: NEGATIVE mg/dL
Leukocytes, UA: NEGATIVE
NITRITE: NEGATIVE
PH: 5.5 (ref 5.0–8.0)
Protein, ur: NEGATIVE mg/dL

## 2015-11-29 MED ORDER — PENICILLIN G BENZATHINE 1200000 UNIT/2ML IM SUSP
1.2000 10*6.[IU] | Freq: Once | INTRAMUSCULAR | Status: AC
  Administered 2015-11-30: 1.2 10*6.[IU] via INTRAMUSCULAR
  Filled 2015-11-29: qty 2

## 2015-11-29 NOTE — ED Triage Notes (Signed)
Pt reports L hip pain starting today at 5AM, states woke up with it and unsure of any injury. Ambulatory with a steady gait. CMS intact, no swelling noted to extremity. Pt also reports sore to tip of penis x2 days, states [facility] told him he has syphilis today. Denies drainage, reports burning with urination. Denies fevers.

## 2015-11-29 NOTE — ED Provider Notes (Signed)
[facility] DEPT Provider Note   CSN: [id number] Arrival date & time: 11/29/15  2132  By signing my name below, I, Hansel Feinstein, attest that this documentation has been prepared under the direction and in the presence of Tiran Sauseda, MD. Electronically Signed: Hansel Feinstein, ED Scribe. 11/30/15. 12:06 AM.     History   Chief Complaint Chief Complaint  Patient presents with  . Hip Pain  . SEXUALLY TRANSMITTED DISEASE    HPI Mitchell Romero is a 30 y.o. male who presents to the Emergency Department complaining of moderate left hip pain onset this morning. Pt states he felt a shooting pain when he stood up this morning as he got out of the bed. He notes that his pain begins in his left lower back and radiates down his leg. He denies recent increased heavy lifting, trauma or falls, but notes that he does repetitive bending and light lifting at his job. He reports that he took 2 aleve this morning and has rested the hip with no significant relief of pain. Pt also notes he was told by the [facility] that he has Syphilis today. He reports he has never tested positive for STD in the past. NKDA. He denies additional complaints.   The history is provided by the patient. No language interpreter was used.  Hip Pain  This is a new problem. The current episode started 6 to 12 hours ago. The problem occurs constantly. The problem has not changed since onset.Pertinent negatives include no abdominal pain and no shortness of breath. Nothing aggravates the symptoms. The symptoms are relieved by NSAIDs. He has tried rest for the symptoms. The treatment provided no relief.    Past Medical History:  Diagnosis Date  . Asthma   . Hypertension    PCP took off HTN meds    There are no active problems to display for this patient.   History reviewed. No pertinent surgical history.     Home Medications    Prior to Admission medications   Medication Sig Start Date End Date Taking? Authorizing  Provider  albuterol (PROVENTIL HFA;VENTOLIN HFA) 108 (90 BASE) MCG/ACT inhaler Inhale 2 puffs into the lungs every 6 (six) hours as needed for wheezing.    Historical Provider, MD  amoxicillin (AMOXIL) 500 MG capsule Take 2 capsules (1,000 mg total) by mouth 2 (two) times daily. 05/21/15   Janne Napoleon, NP  benzocaine (ORAJEL) 10 % mucosal gel Use as directed 1 application in the mouth or throat as needed for mouth pain.    Historical Provider, MD  HYDROcodone-acetaminophen (NORCO/VICODIN) 5-325 MG tablet Take 1 tablet by mouth every 4 (four) hours as needed. 05/21/15   Janne Napoleon, NP  ibuprofen (ADVIL,MOTRIN) 200 MG tablet Take 400 mg by mouth every 6 (six) hours as needed for pain.    Historical Provider, MD  loratadine (CLARITIN) 10 MG tablet Take 1 tablet (10 mg total) by mouth daily. 06/29/15   Nona Dell, PA-C  naphazoline-pheniramine (NAPHCON-A) 0.025-0.3 % ophthalmic solution Place 1 drop into both eyes 4 (four) times daily as needed for irritation or allergies. 06/29/15   Nona Dell, PA-C  naproxen (NAPROSYN) 375 MG tablet Take 1 tablet (375 mg total) by mouth 2 (two) times daily. 05/21/15   Janne Napoleon, NP    Family History No family history on file.  Social History Social History  Substance Use Topics  . Smoking status: Current Every Day Smoker    Types: Cigarettes  . Smokeless tobacco: Never  Used  . Alcohol use Yes     Comment: occasional     Allergies   Peanut-containing drug products; Latex; and Powder   Review of Systems Review of Systems  Constitutional: Negative for fever.  Respiratory: Negative for shortness of breath.   Gastrointestinal: Negative for abdominal pain.  Genitourinary: Negative for difficulty urinating, discharge, dysuria and genital sores.  Musculoskeletal: Positive for arthralgias and back pain. Negative for neck pain and neck stiffness.  Neurological: Negative for weakness and numbness.  All other systems reviewed and are  negative.    Physical Exam Updated Vital Signs BP 132/80 (BP Location: Left Arm)   Pulse 104   Temp 97.9 F (36.6 C) (Oral)   Resp 18   Ht 5\' 7"  (1.702 m)   Wt 220 lb (99.8 kg)   SpO2 100%   BMI 34.46 kg/m   Physical Exam  Constitutional: He is oriented to person, place, and time. He appears well-developed and well-nourished.  HENT:  Head: Normocephalic and atraumatic.  Mouth/Throat: Oropharynx is clear and moist. No oropharyngeal exudate.  Moist mucous membranes.   Eyes: Conjunctivae and EOM are normal. Pupils are equal, round, and reactive to light.  Neck: Normal range of motion. Neck supple. No JVD present. No tracheal deviation present.  No carotid bruits. Trachea midline.   Cardiovascular: Normal rate, regular rhythm and normal heart sounds.  Exam reveals no gallop and no friction rub.   No murmur heard. RRR.   Pulmonary/Chest: Effort normal and breath sounds normal. No stridor. No respiratory distress. He has no wheezes. He has no rales.  Lungs CTA bilaterally.   Abdominal: Soft. Bowel sounds are normal. He exhibits no distension. There is no rebound and no guarding.  Musculoskeletal: Normal range of motion.       Left hip: Normal.       Right knee: Normal.       Left knee: Normal.       Cervical back: Normal.       Thoracic back: Normal.       Lumbar back: Normal.  Spasm to the sacral area. No pain over the left hip joint. Intact DPs. DTRs intact.   Lymphadenopathy:    He has no cervical adenopathy.  Neurological: He is alert and oriented to person, place, and time. He has normal reflexes.  Skin: Skin is warm and dry.  Psychiatric: He has a normal mood and affect.  Nursing note and vitals reviewed.    ED Treatments / Results   Vitals:   11/30/15 0100 11/30/15 0115  BP: 118/81 116/69  Pulse: 91 95  Resp:    Temp:     Results for orders placed or performed during the hospital encounter of 11/29/15  Urinalysis, Routine w reflex microscopic (not at [facility])    Result Value Ref Range   Color, Urine YELLOW YELLOW   APPearance CLEAR CLEAR   Specific Gravity, Urine >1.030 (H) 1.005 - 1.030   pH 5.5 5.0 - 8.0   Glucose, UA NEGATIVE NEGATIVE mg/dL   Hgb urine dipstick SMALL (A) NEGATIVE   Bilirubin Urine SMALL (A) NEGATIVE   Ketones, ur NEGATIVE NEGATIVE mg/dL   Protein, ur NEGATIVE NEGATIVE mg/dL   Nitrite NEGATIVE NEGATIVE   Leukocytes, UA NEGATIVE NEGATIVE  Urine microscopic-add on  Result Value Ref Range   Squamous Epithelial / LPF 0-5 (A) NONE SEEN   WBC, UA 0-5 0 - 5 WBC/hpf   RBC / HPF 0-5 0 - 5 RBC/hpf   Bacteria, UA  RARE (A) NONE SEEN   Dg Hip Unilat With Pelvis 2-3 Views Left  Result Date: 11/29/2015 CLINICAL DATA:  Left hip pain starting today at 5 a.m.  No injury. EXAM: DG HIP (WITH OR WITHOUT PELVIS) 2-3V LEFT COMPARISON:  None. FINDINGS: There is no evidence of hip fracture or dislocation. There is no evidence of arthropathy or other focal bone abnormality. IMPRESSION: Negative. Electronically Signed   By: Lucienne Capers M.D.   On: 11/29/2015 22:11   Medications  penicillin g benzathine (BICILLIN LA) 1200000 UNIT/2ML injection 1.2 Million Units (1.2 Million Units Intramuscular Given 11/30/15 0032)  ketorolac (TORADOL) injection 60 mg (60 mg Intramuscular Given 11/30/15 0029)  methocarbamol (ROBAXIN) tablet 1,000 mg (1,000 mg Oral Given 11/30/15 0028)   DIAGNOSTIC STUDIES: Oxygen Saturation is 100% on RA, normal by my interpretation.    COORDINATION OF CARE: 12:02 AM Discussed treatment plan with pt at bedside which includes lab work, XR, IM penicillin and pt agreed to plan.    Labs (all labs ordered are listed, but only abnormal results are displayed) Labs Reviewed  URINALYSIS, ROUTINE W REFLEX MICROSCOPIC (NOT AT [facility]) - Abnormal; Notable for the following:       Result Value   Specific Gravity, Urine >1.030 (*)    Hgb urine dipstick SMALL (*)    Bilirubin Urine SMALL (*)    All other components within normal limits   URINE MICROSCOPIC-ADD ON - Abnormal; Notable for the following:    Squamous Epithelial / LPF 0-5 (*)    Bacteria, UA RARE (*)    All other components within normal limits  RPR  GC/CHLAMYDIA PROBE AMP ([place]) NOT AT [facility]    EKG  EKG Interpretation None       Radiology Dg Hip Unilat With Pelvis 2-3 Views Left  Result Date: 11/29/2015 CLINICAL DATA:  Left hip pain starting today at 5 a.m.  No injury. EXAM: DG HIP (WITH OR WITHOUT PELVIS) 2-3V LEFT COMPARISON:  None. FINDINGS: There is no evidence of hip fracture or dislocation. There is no evidence of arthropathy or other focal bone abnormality. IMPRESSION: Negative. Electronically Signed   By: Lucienne Capers M.D.   On: 11/29/2015 22:11    Procedures Procedures (including critical care time)  Medications Ordered in ED Medications  penicillin g benzathine (BICILLIN LA) 1200000 UNIT/2ML injection 1.2 Million Units (1.2 Million Units Intramuscular Given 11/30/15 0032)  ketorolac (TORADOL) injection 60 mg (60 mg Intramuscular Given 11/30/15 0029)  methocarbamol (ROBAXIN) tablet 1,000 mg (1,000 mg Oral Given 11/30/15 0028)     Initial Impression / Assessment and Plan / ED Course  I have reviewed the triage vital signs and the nursing notes.  Pertinent labs & imaging results that were available during my care of the patient were reviewed by me and considered in my medical decision making (see chart for details).   Final Clinical Impressions(s) / ED Diagnoses   Final diagnoses:  Pain    New Prescriptions New Prescriptions   No medications on file  Symptoms consistent with sciatica.  Will treat for syphilis, tests sent.  [facility] up with the [facility].  No sexual activity of any kind until 2 weeks after all partners tested and treated.All questions answered to patient's satisfaction. Based on history and exam patient has been appropriately medically screened and emergency conditions excluded. Patient is stable  for discharge at this time. Follow up with your PMD for recheck in 2 days and strict return precautions given.  I personally performed  the services described in this documentation, which was scribed in my presence. The recorded information has been reviewed and is accurate.      Veatrice Kells, MD 11/30/15 [phone]

## 2015-11-30 ENCOUNTER — Encounter (HOSPITAL_COMMUNITY): Payer: Self-pay | Admitting: Emergency Medicine

## 2015-11-30 MED ORDER — NAPROXEN 375 MG PO TABS: 375.0000 mg | ORAL_TABLET | Freq: Two times a day (BID) | ORAL | 0 refills | Status: DC

## 2015-11-30 MED ORDER — METHOCARBAMOL 500 MG PO TABS: 500.0000 mg | ORAL_TABLET | Freq: Two times a day (BID) | ORAL | 0 refills | Status: DC

## 2015-11-30 MED ORDER — KETOROLAC TROMETHAMINE 60 MG/2ML IM SOLN
60.0000 mg | Freq: Once | INTRAMUSCULAR | Status: AC
  Administered 2015-11-30: 60 mg via INTRAMUSCULAR
  Filled 2015-11-30: qty 2

## 2015-11-30 MED ORDER — METHOCARBAMOL 500 MG PO TABS
1000.0000 mg | ORAL_TABLET | Freq: Once | ORAL | Status: AC
  Administered 2015-11-30: 1000 mg via ORAL
  Filled 2015-11-30: qty 2

## 2015-11-30 NOTE — ED Notes (Signed)
Pt departed in NAD, refused use of wheelchair.  

## 2015-12-01 ENCOUNTER — Telehealth (HOSPITAL_BASED_OUTPATIENT_CLINIC_OR_DEPARTMENT_OTHER): Payer: Self-pay | Admitting: Emergency Medicine

## 2015-12-01 LAB — GC/CHLAMYDIA PROBE AMP (~~LOC~~) NOT AT ARMC
Chlamydia: NEGATIVE
Neisseria Gonorrhea: NEGATIVE

## 2015-12-01 LAB — RPR, QUANT+TP ABS (REFLEX)
Rapid Plasma Reagin, Quant: 1:32 {titer} — ABNORMAL HIGH
T Pallidum Abs: POSITIVE — AB

## 2015-12-01 LAB — RPR: RPR: REACTIVE — AB

## 2016-06-08 DIAGNOSIS — Z91010 Allergy to peanuts: Secondary | ICD-10-CM | POA: Insufficient documentation

## 2016-06-08 DIAGNOSIS — Z91040 Latex allergy status: Secondary | ICD-10-CM | POA: Insufficient documentation

## 2016-06-08 DIAGNOSIS — J069 Acute upper respiratory infection, unspecified: Secondary | ICD-10-CM | POA: Insufficient documentation

## 2016-06-08 DIAGNOSIS — J45909 Unspecified asthma, uncomplicated: Secondary | ICD-10-CM | POA: Insufficient documentation

## 2016-06-08 DIAGNOSIS — F17210 Nicotine dependence, cigarettes, uncomplicated: Secondary | ICD-10-CM | POA: Insufficient documentation

## 2016-06-08 DIAGNOSIS — I10 Essential (primary) hypertension: Secondary | ICD-10-CM | POA: Insufficient documentation

## 2016-06-09 ENCOUNTER — Emergency Department (HOSPITAL_COMMUNITY)
Admission: EM | Admit: 2016-06-09 | Discharge: 2016-06-09 | Disposition: A | Discharge status: Home / Self Care | Payer: Self-pay | Attending: Emergency Medicine | Admitting: Emergency Medicine

## 2016-06-09 ENCOUNTER — Emergency Department (HOSPITAL_COMMUNITY): Payer: Self-pay

## 2016-06-09 ENCOUNTER — Encounter (HOSPITAL_COMMUNITY): Payer: Self-pay | Admitting: Emergency Medicine

## 2016-06-09 DIAGNOSIS — B9789 Other viral agents as the cause of diseases classified elsewhere: Secondary | ICD-10-CM

## 2016-06-09 DIAGNOSIS — J069 Acute upper respiratory infection, unspecified: Secondary | ICD-10-CM

## 2016-06-09 MED ORDER — PHENOL 1.4 % MT LIQD: 1.0000 | OROMUCOSAL | 0 refills | Status: DC | PRN

## 2016-06-09 MED ORDER — BENZONATATE 100 MG PO CAPS
100.0000 mg | ORAL_CAPSULE | Freq: Once | ORAL | Status: AC
  Administered 2016-06-09: 100 mg via ORAL
  Filled 2016-06-09: qty 1

## 2016-06-09 MED ORDER — BENZONATATE 100 MG PO CAPS: 100.0000 mg | ORAL_CAPSULE | Freq: Three times a day (TID) | ORAL | 0 refills | Status: DC

## 2016-06-09 NOTE — Discharge Instructions (Signed)
Take the prescribed medication as directed. °Follow-up with your primary care doctor. °Return to the ED for new or worsening symptoms. °

## 2016-06-09 NOTE — ED Triage Notes (Signed)
Patient reports persistent dry cough with chest congestion , rhinorrhea/nasal congestion with post nasal drip and sore throat onset last week , denies fever or chills .

## 2016-06-09 NOTE — ED Provider Notes (Signed)
[facility] DEPT Provider Note   CSN: [id number] Arrival date & time: 06/08/16  2338     History   Chief Complaint Chief Complaint  Patient presents with  . Cough    Chest Congestion   . Nasal Congestion    HPI Mitchell Romero is a 31 y.o. male.  The history is provided by the patient and medical records.  Cough  Associated symptoms include rhinorrhea, sore throat and myalgias.    31 year old male with history of asthma and hypertension, presenting to the ED for cough, nasal congestion, sore throat, and body aches which began last week. He is unsure of sick contacts. Cough has been dry. He denies any fever or chills. Has been able to eat and drink normally. States he needs a work note.  Has not tried any medications for his symptoms.  Past Medical History:  Diagnosis Date  . Asthma   . Hypertension    PCP took off HTN meds    There are no active problems to display for this patient.   History reviewed. No pertinent surgical history.     Home Medications    Prior to Admission medications   Medication Sig Start Date End Date Taking? Authorizing Provider  amoxicillin (AMOXIL) 500 MG capsule Take 2 capsules (1,000 mg total) by mouth 2 (two) times daily. Patient not taking: Reported on 11/30/2015 05/21/15   Janne Napoleon, NP  HYDROcodone-acetaminophen (NORCO/VICODIN) 5-325 MG tablet Take 1 tablet by mouth every 4 (four) hours as needed. Patient not taking: Reported on 11/30/2015 05/21/15   Janne Napoleon, NP  loratadine (CLARITIN) 10 MG tablet Take 1 tablet (10 mg total) by mouth daily. Patient not taking: Reported on 11/30/2015 06/29/15   Nona Dell, PA-C  methocarbamol (ROBAXIN) 500 MG tablet Take 1 tablet (500 mg total) by mouth 2 (two) times daily. 11/30/15   April Palumbo, MD  naphazoline-pheniramine (NAPHCON-A) 0.025-0.3 % ophthalmic solution Place 1 drop into both eyes 4 (four) times daily as needed for irritation or allergies. Patient not taking: Reported on  11/30/2015 06/29/15   Nona Dell, PA-C  naproxen (NAPROSYN) 375 MG tablet Take 1 tablet (375 mg total) by mouth 2 (two) times daily. 11/30/15   April Palumbo, MD    Family History No family history on file.  Social History Social History  Substance Use Topics  . Smoking status: Current Every Day Smoker    Types: Cigarettes  . Smokeless tobacco: Never Used  . Alcohol use Yes     Comment: occasional     Allergies   Peanut-containing drug products; Latex; and Powder   Review of Systems Review of Systems  HENT: Positive for congestion, rhinorrhea and sore throat.   Respiratory: Positive for cough.   Musculoskeletal: Positive for myalgias.  All other systems reviewed and are negative.    Physical Exam Updated Vital Signs BP 130/87   Pulse 98   Temp 98.5 F (36.9 C) (Oral)   Resp 18   Ht 5\' 6"  (1.676 m)   Wt 98.4 kg   SpO2 96%   BMI 35.02 kg/m   Physical Exam  Constitutional: He is oriented to person, place, and time. He appears well-developed and well-nourished.  HENT:  Head: Normocephalic and atraumatic.  Right Ear: Tympanic membrane and ear canal normal.  Left Ear: Tympanic membrane and ear canal normal.  Nose: Nose normal.  Mouth/Throat: Uvula is midline, oropharynx is clear and moist and mucous membranes are normal. No oropharyngeal exudate, posterior oropharyngeal edema, posterior oropharyngeal  erythema or tonsillar abscesses.  Eyes: Conjunctivae and EOM are normal. Pupils are equal, round, and reactive to light.  Neck: Normal range of motion.  Cardiovascular: Normal rate, regular rhythm and normal heart sounds.   Pulmonary/Chest: Effort normal and breath sounds normal. No respiratory distress. He has no wheezes. He has no rhonchi.  Abdominal: Soft. Bowel sounds are normal. There is no tenderness. There is no rebound.  Musculoskeletal: Normal range of motion.  Neurological: He is alert and oriented to person, place, and time.  Skin: Skin is warm and  dry.  Psychiatric: He has a normal mood and affect.  Nursing note and vitals reviewed.    ED Treatments / Results  Labs (all labs ordered are listed, but only abnormal results are displayed) Labs Reviewed - No data to display  EKG  EKG Interpretation None       Radiology Dg Chest 2 View  Result Date: 06/09/2016 CLINICAL DATA:  Cough and chest congestion. Rhinorrhea and nasal congestion. Sore throat. Onset last week. EXAM: CHEST  2 VIEW COMPARISON:  07/04/2012 FINDINGS: The heart size and mediastinal contours are within normal limits. Both lungs are clear. The visualized skeletal structures are unremarkable. IMPRESSION: No active cardiopulmonary disease. Electronically Signed   By: Lucienne Capers M.D.   On: 06/09/2016 01:39    Procedures Procedures (including critical care time)  Medications Ordered in ED Medications - No data to display   Initial Impression / Assessment and Plan / ED Course  I have reviewed the triage vital signs and the nursing notes.  Pertinent labs & imaging results that were available during my care of the patient were reviewed by me and considered in my medical decision making (see chart for details).  31 year old male here with URI type symptoms for the past week. He is afebrile and nontoxic. He overall appears well. Exam is benign. Chest x-ray was obtained from triage and is negative for any acute findings. Suspect viral process. We'll plan to discharge home with supportive care. Follow-up with PCP.  Work note given.  Discussed plan with patient, he acknowledged understanding and agreed with plan of care.  Return precautions given for new or worsening symptoms.  Final Clinical Impressions(s) / ED Diagnoses   Final diagnoses:  Viral URI with cough    New Prescriptions Discharge Medication List as of 06/09/2016  3:35 AM    START taking these medications   Details  benzonatate (TESSALON) 100 MG capsule Take 1 capsule (100 mg total) by mouth every  8 (eight) hours., Starting Wed 06/09/2016, Print    phenol (CHLORASEPTIC) 1.4 % LIQD Use as directed 1 spray in the mouth or throat as needed for throat irritation / pain., Starting Wed 06/09/2016, Print         Larene Pickett, PA-C 06/09/16 0448    Everlene Balls, MD 06/09/16 [phone]

## 2016-07-29 ENCOUNTER — Encounter (HOSPITAL_COMMUNITY): Payer: Self-pay | Admitting: Emergency Medicine

## 2016-07-29 ENCOUNTER — Emergency Department (HOSPITAL_COMMUNITY)
Admission: EM | Admit: 2016-07-29 | Discharge: 2016-07-29 | Disposition: A | Discharge status: Home / Self Care | Payer: Self-pay | Attending: Emergency Medicine | Admitting: Emergency Medicine

## 2016-07-29 DIAGNOSIS — Z79899 Other long term (current) drug therapy: Secondary | ICD-10-CM | POA: Insufficient documentation

## 2016-07-29 DIAGNOSIS — Z91040 Latex allergy status: Secondary | ICD-10-CM | POA: Insufficient documentation

## 2016-07-29 DIAGNOSIS — J45909 Unspecified asthma, uncomplicated: Secondary | ICD-10-CM | POA: Insufficient documentation

## 2016-07-29 DIAGNOSIS — R112 Nausea with vomiting, unspecified: Secondary | ICD-10-CM

## 2016-07-29 DIAGNOSIS — F1290 Cannabis use, unspecified, uncomplicated: Secondary | ICD-10-CM

## 2016-07-29 DIAGNOSIS — R1084 Generalized abdominal pain: Secondary | ICD-10-CM

## 2016-07-29 DIAGNOSIS — F17210 Nicotine dependence, cigarettes, uncomplicated: Secondary | ICD-10-CM | POA: Insufficient documentation

## 2016-07-29 DIAGNOSIS — R197 Diarrhea, unspecified: Secondary | ICD-10-CM

## 2016-07-29 DIAGNOSIS — I10 Essential (primary) hypertension: Secondary | ICD-10-CM | POA: Insufficient documentation

## 2016-07-29 LAB — COMPREHENSIVE METABOLIC PANEL
ALK PHOS: 49 U/L (ref 38–126)
ALT: 25 U/L (ref 17–63)
AST: 30 U/L (ref 15–41)
Albumin: 4.5 g/dL (ref 3.5–5.0)
Anion gap: 8 (ref 5–15)
BUN: 11 mg/dL (ref 6–20)
CALCIUM: 9 mg/dL (ref 8.9–10.3)
CHLORIDE: 107 mmol/L (ref 101–111)
CO2: 23 mmol/L (ref 22–32)
CREATININE: 0.97 mg/dL (ref 0.61–1.24)
Glucose, Bld: 113 mg/dL — ABNORMAL HIGH (ref 65–99)
Potassium: 4.2 mmol/L (ref 3.5–5.1)
Sodium: 138 mmol/L (ref 135–145)
Total Bilirubin: 0.9 mg/dL (ref 0.3–1.2)
Total Protein: 7.2 g/dL (ref 6.5–8.1)

## 2016-07-29 LAB — URINALYSIS, ROUTINE W REFLEX MICROSCOPIC
BILIRUBIN URINE: NEGATIVE
GLUCOSE, UA: NEGATIVE mg/dL
HGB URINE DIPSTICK: NEGATIVE
KETONES UR: NEGATIVE mg/dL
Leukocytes, UA: NEGATIVE
Nitrite: NEGATIVE
PROTEIN: NEGATIVE mg/dL
Specific Gravity, Urine: 1.026 (ref 1.005–1.030)
pH: 5 (ref 5.0–8.0)

## 2016-07-29 LAB — CBC
HCT: 47.1 % (ref 39.0–52.0)
Hemoglobin: 16.5 g/dL (ref 13.0–17.0)
MCH: 31.9 pg (ref 26.0–34.0)
MCHC: 35 g/dL (ref 30.0–36.0)
MCV: 90.9 fL (ref 78.0–100.0)
PLATELETS: 253 10*3/uL (ref 150–400)
RBC: 5.18 MIL/uL (ref 4.22–5.81)
RDW: 13.1 % (ref 11.5–15.5)
WBC: 14.7 10*3/uL — ABNORMAL HIGH (ref 4.0–10.5)

## 2016-07-29 LAB — RAPID HIV SCREEN (HIV 1/2 AB+AG)
HIV 1/2 Antibodies: NONREACTIVE
HIV-1 P24 ANTIGEN - HIV24: NONREACTIVE

## 2016-07-29 LAB — LIPASE, BLOOD: Lipase: 59 U/L — ABNORMAL HIGH (ref 11–51)

## 2016-07-29 MED ORDER — SODIUM CHLORIDE 0.9 % IV BOLUS (SEPSIS)
1000.0000 mL | Freq: Once | INTRAVENOUS | Status: AC
  Administered 2016-07-29: 1000 mL via INTRAVENOUS

## 2016-07-29 MED ORDER — ONDANSETRON 4 MG PO TBDP: 4.0000 mg | ORAL_TABLET | Freq: Three times a day (TID) | ORAL | 0 refills | Status: DC | PRN

## 2016-07-29 MED ORDER — ACETAMINOPHEN 500 MG PO TABS
1000.0000 mg | ORAL_TABLET | Freq: Once | ORAL | Status: AC
  Administered 2016-07-29: 1000 mg via ORAL
  Filled 2016-07-29: qty 2

## 2016-07-29 MED ORDER — ONDANSETRON 4 MG PO TBDP
4.0000 mg | ORAL_TABLET | Freq: Once | ORAL | Status: AC
  Administered 2016-07-29: 4 mg via ORAL
  Filled 2016-07-29: qty 1

## 2016-07-29 NOTE — ED Notes (Signed)
Pt up to nurse first stating he feels like he has been waiting too long and he does not understand how people have gotten here after him and gone back before him. Pt explained the process of how we pull people back by acuity/fast track, this RN apologized to pt for his wait and advised we are cleaning a room and his name should be called to go back soon.

## 2016-07-29 NOTE — ED Notes (Signed)
Called for pt in lobby, no response

## 2016-07-29 NOTE — Discharge Instructions (Signed)
PLEASE COME BACK TO THE EMERGENCY ROOM IF YOU EXPERIENCE WORSENING ABDOMINAL PAIN IN THE NEXT 12-24 HOURS.  Additionally, please return if your headache worsens or you develop any new or concerning symptoms.  Your symptoms today may be consistent with marijuana associated vomiting (cannabinoid hyperemesis syndrome).  This is a syndrome seen in people who use marijuana. It is associated with nausea, vomiting, abdominal pain, and generally not feeling well.  Often times patients will report that their symptoms can be relieved with a warm shower.  The best way to prevent this is to completely stop smoking/using all forms of marijuana.  Some people find symptom relief from applying capsaicin cream to their abdomen (belly).  Please be advised that capsaicin cream may cause a burning feeling.  Capsaicin cream may relieve some abdominal symptoms, however it will not prevent or fully resolve this condition.

## 2016-07-29 NOTE — ED Provider Notes (Signed)
[facility] DEPT Provider Note   CSN: [id number] Arrival date & time: 07/29/16  0945     History   Chief Complaint Chief Complaint  Patient presents with  . Emesis  . Headache    HPI  Mitchell Romero is a 31 y.o. male who presents with Three weeks of nausea, and fatigue.  He reports he is concerned because his cousin had HIV and he displayed similar symptoms.  Patient Reports that over year ago he was exposed to HIV.  He has received testing since then that has been negative. He reports that he is here today because this morning he began experiencing vomiting and diarrhea. Reports that he has had multiple episodes of each. He denies any known sick contacts. He states that he has not had fevers at home however endorses chills.  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 He does not have any new rashes, no rashes on his hands/feet. He states that he has chronic eczema and that is unchanged.   HPI  Past Medical History:  Diagnosis Date  . Asthma   . Hypertension    PCP took off HTN meds    There are no active problems to display for this patient.   History reviewed. No pertinent surgical history.     Home Medications    Prior to Admission medications   Medication Sig Start Date End Date Taking? Authorizing Provider  Tetrahydrozoline HCl (VISINE OP) Place 1 drop into both eyes daily as needed (Redness and Irritation).   Yes [provider]  loratadine (CLARITIN) 10 MG tablet Take 1 tablet (10 mg total) by mouth daily. Patient not taking: Reported on 11/30/2015 06/29/15   Nona Dell, PA-C  ondansetron (ZOFRAN ODT) 4 MG disintegrating tablet Take 1 tablet (4 mg total) by mouth every 8 (eight) hours as needed for nausea or vomiting. 07/29/16   Lorin Glass, PA-C    Family History No family history on file.  Social History Social History  Substance Use Topics  . Smoking status: Current Every Day  Smoker    Types: Cigarettes  . Smokeless tobacco: Never Used  . Alcohol use Yes     Comment: occasional     Allergies   Peanut-containing drug products; Food; Latex; and Powder   Review of Systems Review of Systems  Constitutional: Positive for fatigue. Negative for activity change, chills and fever.  HENT: Negative for ear pain and sore throat.   Eyes: Negative for pain and visual disturbance.  Respiratory: Negative for cough and shortness of breath.   Cardiovascular: Negative for chest pain and palpitations.  Gastrointestinal: Positive for abdominal pain, diarrhea, nausea and vomiting. Negative for abdominal distention.  Genitourinary: Negative for dysuria and hematuria.  Musculoskeletal: Negative for arthralgias and back pain.  Skin: Negative for color change and rash.  Neurological: Positive for headaches. Negative for seizures, syncope, facial asymmetry, weakness and light-headedness.  All other systems reviewed and are negative.    Physical Exam Updated Vital Signs BP 129/74   Pulse 94   Temp 97.9 F (36.6 C) (Oral)   Resp 16   SpO2 97%   Physical Exam  Constitutional: He appears well-developed and well-nourished.  HENT:  Head: Normocephalic and atraumatic.  Right Ear: External ear normal.  Left Ear: External ear normal.  Nose: Nose normal.  Mouth/Throat: Oropharynx is clear and moist. No oropharyngeal exudate.  Eyes: Conjunctivae and EOM are normal. Pupils are equal, round, and reactive to  light.  Neck: Normal range of motion. Neck supple. No tracheal deviation present.  Cardiovascular: Normal rate, regular rhythm and intact distal pulses.  Exam reveals no gallop and no friction rub.   No murmur heard. Pulmonary/Chest: Effort normal and breath sounds normal. No respiratory distress. He has no wheezes.  Abdominal: Soft. Normal appearance and bowel sounds are normal. He exhibits no distension. There is no hepatosplenomegaly. There is no tenderness (No  tenderness, but is painful diffusely). There is no guarding.  Musculoskeletal: He exhibits no edema or deformity.  Lymphadenopathy:    He has no cervical adenopathy.  Neurological: He is alert. No cranial nerve deficit or sensory deficit. He exhibits normal muscle tone.  Skin: Skin is warm and dry. He is not diaphoretic.  No rashes to palms/soles.    Psychiatric: He has a normal mood and affect. His behavior is normal.  Nursing note and vitals reviewed.    ED Treatments / Results  Labs (all labs ordered are listed, but only abnormal results are displayed) Labs Reviewed  LIPASE, BLOOD - Abnormal; Notable for the following:       Result Value   Lipase 59 (*)    All other components within normal limits  COMPREHENSIVE METABOLIC PANEL - Abnormal; Notable for the following:    Glucose, Bld 113 (*)    All other components within normal limits  CBC - Abnormal; Notable for the following:    WBC 14.7 (*)    All other components within normal limits  URINALYSIS, ROUTINE W REFLEX MICROSCOPIC  RAPID HIV SCREEN (HIV 1/2 AB+AG)    EKG  EKG Interpretation None       Radiology No results found.  Procedures Procedures (including critical care time)  Medications Ordered in ED Medications  sodium chloride 0.9 % bolus 1,000 mL (0 mLs Intravenous Stopped 07/29/16 1424)  ondansetron (ZOFRAN-ODT) disintegrating tablet 4 mg (4 mg Oral Given 07/29/16 1246)  acetaminophen (TYLENOL) tablet 1,000 mg (1,000 mg Oral Given 07/29/16 1424)     Initial Impression / Assessment and Plan / ED Course  I have reviewed the triage vital signs and the nursing notes.  Pertinent labs & imaging results that were available during my care of the patient were reviewed by me and considered in my medical decision making (see chart for details).  Clinical Course as of Jul 30 1955  Thu Jul 29, 2016  1121 Attempted to see patient, not in room.   [EH]    Clinical Course User Index [EH] Lorin Glass, PA-C    Patient with symptoms consistent with viral gastroenteritis.  Vitals are stable, no fever.  No signs of dehydration, tolerating PO fluids > 6 oz.  Lungs are clear.  No focal abdominal pain, no concern for appendicitis, cholecystitis, pancreatitis, ruptured viscus, UTI, kidney stone, or any other abdominal etiology.  Supportive therapy indicated with return if symptoms worsen.  Patient counseled. Patient was also informed that mariajuana use may be contributing to his symptoms and he was informed of his negative HIV test.  Patient was given the option to ask questions, all of which were answered to the best of my ability.  Patient will be given rx for zofran for symptom control.  Patient was seen by Dr. Ralene Bathe who evaluated the patient and agreed with my plan.   Final Clinical Impressions(s) / ED Diagnoses   Final diagnoses:  Nausea vomiting and diarrhea  Generalized abdominal pain  Marijuana use, continuous    New Prescriptions Discharge Medication List as  of 07/29/2016  2:54 PM    START taking these medications   Details  ondansetron (ZOFRAN ODT) 4 MG disintegrating tablet Take 1 tablet (4 mg total) by mouth every 8 (eight) hours as needed for nausea or vomiting., Starting Thu 07/29/2016, Print         Lorin Glass, PA-C 07/29/16 Patrecia Pour    Quintella Reichert, MD 07/30/16 [phone]

## 2016-07-29 NOTE — ED Notes (Signed)
Walked patient to the bathroom patient did well 

## 2016-07-29 NOTE — ED Triage Notes (Signed)
Pt reports headache, vomiting and diarrhea. States generalized body aches, unsure if he has had fevers at home. pt a/ox4, nad.

## 2016-09-24 ENCOUNTER — Emergency Department (HOSPITAL_COMMUNITY)
Admission: EM | Admit: 2016-09-24 | Discharge: 2016-09-24 | Disposition: A | Discharge status: Home / Self Care | Payer: Self-pay | Attending: Emergency Medicine | Admitting: Emergency Medicine

## 2016-09-24 ENCOUNTER — Encounter (HOSPITAL_COMMUNITY): Payer: Self-pay | Admitting: Emergency Medicine

## 2016-09-24 DIAGNOSIS — J45909 Unspecified asthma, uncomplicated: Secondary | ICD-10-CM | POA: Insufficient documentation

## 2016-09-24 DIAGNOSIS — F17210 Nicotine dependence, cigarettes, uncomplicated: Secondary | ICD-10-CM | POA: Insufficient documentation

## 2016-09-24 DIAGNOSIS — R1030 Lower abdominal pain, unspecified: Secondary | ICD-10-CM | POA: Insufficient documentation

## 2016-09-24 DIAGNOSIS — K625 Hemorrhage of anus and rectum: Secondary | ICD-10-CM | POA: Insufficient documentation

## 2016-09-24 LAB — TYPE AND SCREEN
ABO/RH(D): B POS
ANTIBODY SCREEN: NEGATIVE

## 2016-09-24 LAB — COMPREHENSIVE METABOLIC PANEL
ALT: 27 U/L (ref 17–63)
ANION GAP: 7 (ref 5–15)
AST: 25 U/L (ref 15–41)
Albumin: 4.5 g/dL (ref 3.5–5.0)
Alkaline Phosphatase: 44 U/L (ref 38–126)
BUN: 7 mg/dL (ref 6–20)
CHLORIDE: 108 mmol/L (ref 101–111)
CO2: 24 mmol/L (ref 22–32)
CREATININE: 0.93 mg/dL (ref 0.61–1.24)
Calcium: 9.4 mg/dL (ref 8.9–10.3)
GFR calc Af Amer: >60 mL/min (ref >60)
Glucose, Bld: 130 mg/dL — ABNORMAL HIGH (ref 65–99)
POTASSIUM: 3.6 mmol/L (ref 3.5–5.1)
Sodium: 139 mmol/L (ref 135–145)
Total Bilirubin: 0.9 mg/dL (ref 0.3–1.2)
Total Protein: 7.2 g/dL (ref 6.5–8.1)

## 2016-09-24 LAB — URINALYSIS, ROUTINE W REFLEX MICROSCOPIC
BILIRUBIN URINE: NEGATIVE
Bacteria, UA: NONE SEEN
Glucose, UA: NEGATIVE mg/dL
Ketones, ur: NEGATIVE mg/dL
Leukocytes, UA: NEGATIVE
Nitrite: NEGATIVE
PH: 5 (ref 5.0–8.0)
Protein, ur: NEGATIVE mg/dL
SQUAMOUS EPITHELIAL / LPF: NONE SEEN
Specific Gravity, Urine: 1.024 (ref 1.005–1.030)

## 2016-09-24 LAB — CBC
HEMATOCRIT: 42.4 % (ref 39.0–52.0)
Hemoglobin: 15.1 g/dL (ref 13.0–17.0)
MCH: 31.9 pg (ref 26.0–34.0)
MCHC: 35.6 g/dL (ref 30.0–36.0)
MCV: 89.5 fL (ref 78.0–100.0)
PLATELETS: 249 10*3/uL (ref 150–400)
RBC: 4.74 MIL/uL (ref 4.22–5.81)
RDW: 13 % (ref 11.5–15.5)
WBC: 13.9 10*3/uL — ABNORMAL HIGH (ref 4.0–10.5)

## 2016-09-24 LAB — ABO/RH: ABO/RH(D): B POS

## 2016-09-24 LAB — LIPASE, BLOOD: LIPASE: 38 U/L (ref 11–51)

## 2016-09-24 LAB — POC OCCULT BLOOD, ED: Fecal Occult Bld: NEGATIVE

## 2016-09-24 MED ORDER — ONDANSETRON 4 MG PO TBDP
4.0000 mg | ORAL_TABLET | Freq: Once | ORAL | Status: AC | PRN
  Administered 2016-09-24: 4 mg via ORAL

## 2016-09-24 MED ORDER — POLYETHYLENE GLYCOL 3350 17 GM/SCOOP PO POWD: 1.0000 | Freq: Once | ORAL | 0 refills | Status: AC

## 2016-09-24 MED ORDER — ONDANSETRON 4 MG PO TBDP
ORAL_TABLET | ORAL | Status: AC
  Filled 2016-09-24: qty 1

## 2016-09-24 MED ORDER — DOCUSATE SODIUM 100 MG PO CAPS: 100.0000 mg | ORAL_CAPSULE | Freq: Two times a day (BID) | ORAL | 0 refills | Status: DC

## 2016-09-24 NOTE — ED Notes (Signed)
ED Provider at bedside. 

## 2016-09-24 NOTE — ED Provider Notes (Signed)
[facility] DEPT Provider Note   CSN: [id number] Arrival date & time: 09/24/16  2045     History   Chief Complaint Chief Complaint  Patient presents with  . Abdominal Pain    HPI Mitchell Romero is a 31 y.o. male who presents with rectal bleeding and lower abdominal pain. PMH significant for asthma. He states that he has had a change in his BM over the past 2 weeks. He had a large amount of rectal bleeding today and lower abdominal pressure at work which prompted him to come to the ED. He does engage in anal sex but states that he has not done this in over a month and has not had bleeding like this before. He has associated chills and nausea. He has had constipation and straining over the past several weeks. No prior diagnosis of hemorrhoids. No fever, vomiting, diarrhea, prior abdominal surgeries or colonoscopy. He states he has a family hx of colon cancer.  HPI  Past Medical History:  Diagnosis Date  . Asthma   . Hypertension    PCP took off HTN meds    There are no active problems to display for this patient.   History reviewed. No pertinent surgical history.     Home Medications    Prior to Admission medications   Medication Sig Start Date End Date Taking? Authorizing Provider  albuterol (PROVENTIL HFA;VENTOLIN HFA) 108 (90 Base) MCG/ACT inhaler Inhale 1-2 puffs into the lungs every 6 (six) hours as needed for wheezing or shortness of breath.   Yes [provider]  ondansetron (ZOFRAN ODT) 4 MG disintegrating tablet Take 1 tablet (4 mg total) by mouth every 8 (eight) hours as needed for nausea or vomiting. 07/29/16  Yes Lorin Glass, PA-C  Tetrahydrozoline HCl (VISINE OP) Place 1 drop into both eyes daily as needed (Redness and Irritation).   Yes [provider]    Family History No family history on file.  Social History Social History  Substance Use Topics  . Smoking status: Current Every Day Smoker    Packs/day: 0.20    Types:  Cigarettes  . Smokeless tobacco: Never Used  . Alcohol use Yes     Comment: occasional     Allergies   Peanut-containing drug products; Food; Latex; and Powder   Review of Systems Review of Systems  Constitutional: Positive for chills. Negative for fever.  Respiratory: Negative for shortness of breath.   Cardiovascular: Negative for chest pain.  Gastrointestinal: Positive for abdominal pain, anal bleeding, constipation and nausea (chronic). Negative for blood in stool, diarrhea and vomiting.  Genitourinary: Positive for frequency. Negative for dysuria, penile pain and testicular pain.  Neurological: Negative for light-headedness.  All other systems reviewed and are negative.    Physical Exam Updated Vital Signs BP 128/71   Pulse 73   Temp 98.8 F (37.1 C) (Oral)   Resp 16   Ht 5' 6.5" (1.689 m)   Wt 99.8 kg (220 lb)   SpO2 99%   BMI 34.98 kg/m   Physical Exam  Constitutional: He is oriented to person, place, and time. He appears well-developed and well-nourished. No distress.  HENT:  Head: Normocephalic and atraumatic.  Eyes: Pupils are equal, round, and reactive to light. Conjunctivae are normal. Right eye exhibits no discharge. Left eye exhibits no discharge. No scleral icterus.  Neck: Normal range of motion.  Cardiovascular: Normal rate and regular rhythm.  Exam reveals no gallop and no friction rub.   No murmur heard. Pulmonary/Chest:  Effort normal and breath sounds normal. No respiratory distress. He has no wheezes. He has no rales. He exhibits no tenderness.  Abdominal: Soft. Bowel sounds are normal. He exhibits no distension and no mass. There is no tenderness. There is no rebound and no guarding. No hernia.  Genitourinary:  Genitourinary Comments: Rectal: No gross blood, hemorrhoids, fissures, redness, area of fluctuance, lesions, or tenderness. Chaperone present during exam.   Neurological: He is alert and oriented to person, place, and time.  Skin: Skin is  warm and dry.  Psychiatric: He has a normal mood and affect. His behavior is normal.  Nursing note and vitals reviewed.    ED Treatments / Results  Labs (all labs ordered are listed, but only abnormal results are displayed) Labs Reviewed  COMPREHENSIVE METABOLIC PANEL - Abnormal; Notable for the following:       Result Value   Glucose, Bld 130 (*)    All other components within normal limits  CBC - Abnormal; Notable for the following:    WBC 13.9 (*)    All other components within normal limits  URINALYSIS, ROUTINE W REFLEX MICROSCOPIC - Abnormal; Notable for the following:    Hgb urine dipstick SMALL (*)    All other components within normal limits  LIPASE, BLOOD  POC OCCULT BLOOD, ED  TYPE AND SCREEN  ABO/RH    EKG  EKG Interpretation None       Radiology No results found.  Procedures Procedures (including critical care time)  Medications Ordered in ED Medications  ondansetron (ZOFRAN-ODT) disintegrating tablet 4 mg (4 mg Oral Given 09/24/16 2118)     Initial Impression / Assessment and Plan / ED Course  I have reviewed the triage vital signs and the nursing notes.  Pertinent labs & imaging results that were available during my care of the patient were reviewed by me and considered in my medical decision making (see chart for details).  31 year old male presents with complaints of lower abdominal pressure and episode of rectal bleeding. Vitals are normal. Abdomen is soft, benign. Rectal exam is unremarkable - no red blood with digital exam. CBC remarkable for leukocytosis. Hgb is normal. CMP remarkable for mild hyperglycemia. UA has small amt of hgb. Discussed results with pt. Advised f/u with GI as outpatient due to hx of colon cx and family. Rx for Miralax and Colace given. Return precautions given.  Final Clinical Impressions(s) / ED Diagnoses   Final diagnoses:  Lower abdominal pain  Rectal bleeding    New Prescriptions New Prescriptions   No  medications on file     Iris Pert 09/25/16 0007    Orlie Dakin, MD 09/25/16 [phone]

## 2016-09-24 NOTE — Discharge Instructions (Signed)
Use Miralax until you are having 1 regular bowel movement once a day Use Colace which is a stool softener Follow up with GI (stomach doctor)

## 2016-09-24 NOTE — ED Triage Notes (Signed)
Pt to ED with multiple complaints - states he noticed bright red blood after trying to have bowel movement earlier today - has noticed this a few times over the last few months, but never this much. Patient also reports lower abdominal pressure, like he had to have a bowel movement but then couldn't. Endorses constant nausea, urinary urgency and burning sensation intermittent. Denies fevers/vomiting/diarrhea.

## 2016-09-26 ENCOUNTER — Ambulatory Visit (HOSPITAL_COMMUNITY)
Admission: EM | Admit: 2016-09-26 | Discharge: 2016-09-26 | Disposition: A | Discharge status: Home / Self Care | Payer: Self-pay | Attending: Family Medicine | Admitting: Family Medicine

## 2016-09-26 ENCOUNTER — Encounter (HOSPITAL_COMMUNITY): Payer: Self-pay | Admitting: *Deleted

## 2016-09-26 DIAGNOSIS — K047 Periapical abscess without sinus: Secondary | ICD-10-CM

## 2016-09-26 DIAGNOSIS — K0889 Other specified disorders of teeth and supporting structures: Secondary | ICD-10-CM

## 2016-09-26 MED ORDER — AMOXICILLIN 875 MG PO TABS: 875.0000 mg | ORAL_TABLET | Freq: Two times a day (BID) | ORAL | 0 refills | Status: DC

## 2016-09-26 MED ORDER — IBUPROFEN 800 MG PO TABS: 800.0000 mg | ORAL_TABLET | Freq: Three times a day (TID) | ORAL | 0 refills | Status: DC | PRN

## 2016-09-26 NOTE — ED Provider Notes (Signed)
  [facility]   [id number] 09/26/16 Arrival Time: 2919  ASSESSMENT & PLAN:  1. Pain, dental   2. Dental abscess     Meds ordered this encounter  Medications  . amoxicillin (AMOXIL) 875 MG tablet    Sig: Take 1 tablet (875 mg total) by mouth 2 (two) times daily.    Dispense:  20 tablet    Refill:  0    Order Specific Question:   Supervising Provider    Answer:   Sherlene Shams [[id number]]  . ibuprofen (ADVIL,MOTRIN) 800 MG tablet    Sig: Take 1 tablet (800 mg total) by mouth every 8 (eight) hours as needed.    Dispense:  21 tablet    Refill:  0    Order Specific Question:   Supervising Provider    Answer:   Sherlene Shams [[id number]]    Reviewed expectations re: course of current medical issues. Questions answered. Outlined signs and symptoms indicating need for more acute intervention. Patient verbalized understanding. After Visit Summary given.   SUBJECTIVE:  Mitchell Romero is a 31 y.o. male who presents with complaint of dental pain for a day.  ROS: As per HPI.   OBJECTIVE:  Vitals:   09/26/16 1807  BP: 130/62  Pulse: 72  Resp: 18  Temp: 98.6 F (37 C)  TempSrc: Oral     General appearance: alert; no distress HEENT: normocephalic; atraumatic; conjunctivae normal; TMs normal; nasal mucosa normal; oral mucosa normal.  Left lower teeth with caries Neck: supple Lungs: clear to auscultation bilaterally Heart: regular rate and rhythm    Labs Reviewed - No data to display  No results found.  Allergies  Allergen Reactions  . Peanut-Containing Drug Products Anaphylaxis  . Food Other (See Comments)    Tomatoes cause acid reflux Orange Juice causes vomiting  . Latex Hives  . Powder Rash    Powder inside gloves    PMHx, SurgHx, SocialHx, Medications, and Allergies were reviewed in the Visit Navigator and updated as appropriate.      Lysbeth Penner, [place] 09/26/16 [phone]

## 2016-09-26 NOTE — ED Triage Notes (Signed)
Pt  Reports      Toothache    l  Lower  Side       Pt  Reports     He  Has  Had  Problems  With  That  Tooth  For  Quite   A   While     Pt    reports  The  Pain  Has  Been  Worse    Over the  Last  Several  Days     The  Patient has  Been taking  Amoxicillin that he  Had  On  Hand for the  Symptoms

## 2016-10-10 ENCOUNTER — Ambulatory Visit (HOSPITAL_COMMUNITY)
Admission: EM | Admit: 2016-10-10 | Discharge: 2016-10-10 | Disposition: A | Discharge status: Home / Self Care | Payer: Self-pay | Attending: Family Medicine | Admitting: Family Medicine

## 2016-10-10 ENCOUNTER — Encounter (HOSPITAL_COMMUNITY): Payer: Self-pay | Admitting: Emergency Medicine

## 2016-10-10 DIAGNOSIS — K0889 Other specified disorders of teeth and supporting structures: Secondary | ICD-10-CM

## 2016-10-10 DIAGNOSIS — K029 Dental caries, unspecified: Secondary | ICD-10-CM

## 2016-10-10 MED ORDER — KETOROLAC TROMETHAMINE 60 MG/2ML IM SOLN
45.0000 mg | Freq: Once | INTRAMUSCULAR | Status: AC
  Administered 2016-10-10: 45 mg via INTRAMUSCULAR

## 2016-10-10 MED ORDER — AMOXICILLIN 500 MG PO CAPS: 1000.0000 mg | ORAL_CAPSULE | Freq: Two times a day (BID) | ORAL | 0 refills | Status: DC

## 2016-10-10 MED ORDER — DICLOFENAC POTASSIUM 50 MG PO TABS: 50.0000 mg | ORAL_TABLET | Freq: Three times a day (TID) | ORAL | 0 refills | Status: DC

## 2016-10-10 MED ORDER — TRAMADOL HCL 50 MG PO TABS: 50.0000 mg | ORAL_TABLET | Freq: Four times a day (QID) | ORAL | 0 refills | Status: DC | PRN

## 2016-10-10 MED ORDER — KETOROLAC TROMETHAMINE 60 MG/2ML IM SOLN
INTRAMUSCULAR | Status: AC
  Filled 2016-10-10: qty 2

## 2016-10-10 NOTE — ED Provider Notes (Signed)
[facility]    CSN: [id number] Arrival date & time: 10/10/16  1658     History   Chief Complaint Chief Complaint  Patient presents with  . Dental Pain    HPI Mitchell Romero is a 31 y.o. male.   31 year old male presents to the urgent care for the second time in the past couple weeks for tooth ache. He states the left second molar broke off 3 years ago and has recently started to hurt. States he cannot afford to see a dentist. Does not have a primary care provider.      Past Medical History:  Diagnosis Date  . Asthma   . Hypertension    PCP took off HTN meds    There are no active problems to display for this patient.   History reviewed. No pertinent surgical history.     Home Medications    Prior to Admission medications   Medication Sig Start Date End Date Taking? Authorizing Provider  ibuprofen (ADVIL,MOTRIN) 800 MG tablet Take 1 tablet (800 mg total) by mouth every 8 (eight) hours as needed. 09/26/16  Yes Lysbeth Penner, FNP  albuterol (PROVENTIL HFA;VENTOLIN HFA) 108 (90 Base) MCG/ACT inhaler Inhale 1-2 puffs into the lungs every 6 (six) hours as needed for wheezing or shortness of breath.    [provider]  amoxicillin (AMOXIL) 500 MG capsule Take 2 capsules (1,000 mg total) by mouth 2 (two) times daily. 10/10/16   Janne Napoleon, NP  diclofenac (CATAFLAM) 50 MG tablet Take 1 tablet (50 mg total) by mouth 3 (three) times daily. One tablet TID with food prn pain. 10/10/16   Janne Napoleon, NP  docusate sodium (COLACE) 100 MG capsule Take 1 capsule (100 mg total) by mouth every 12 (twelve) hours. 09/24/16   Recardo Evangelist, PA-C  ondansetron (ZOFRAN ODT) 4 MG disintegrating tablet Take 1 tablet (4 mg total) by mouth every 8 (eight) hours as needed for nausea or vomiting. 07/29/16   Lorin Glass, PA-C  Tetrahydrozoline HCl (VISINE OP) Place 1 drop into both eyes daily as needed (Redness and Irritation).    [provider]  traMADol  (ULTRAM) 50 MG tablet Take 1 tablet (50 mg total) by mouth every 6 (six) hours as needed. 10/10/16   Janne Napoleon, NP    Family History No family history on file.  Social History Social History  Substance Use Topics  . Smoking status: Current Every Day Smoker    Packs/day: 0.20    Types: Cigarettes  . Smokeless tobacco: Never Used  . Alcohol use Yes     Comment: occasional     Allergies   Peanut-containing drug products; Food; Latex; and Powder   Review of Systems Review of Systems  Constitutional: Negative.  Negative for fever.  HENT: Positive for dental problem. Negative for sinus pain and sinus pressure.   Respiratory: Negative.   Neurological: Negative.   All other systems reviewed and are negative.    Physical Exam Triage Vital Signs ED Triage Vitals  Enc Vitals Group     BP 10/10/16 1712 135/87     Pulse Rate 10/10/16 1712 93     Resp 10/10/16 1712 18     Temp 10/10/16 1712 98.6 F (37 C)     Temp Source 10/10/16 1712 Oral     SpO2 10/10/16 1712 97 %     Weight --      Height --      Head Circumference --  Peak Flow --      Pain Score 10/10/16 1710 10     Pain Loc --      Pain Edu? --      Excl. in [place]? --    No data found.   Updated Vital Signs BP 135/87 (BP Location: Left Arm)   Pulse 93   Temp 98.6 F (37 C) (Oral)   Resp 18   SpO2 97%   Visual Acuity Right Eye Distance:   Left Eye Distance:   Bilateral Distance:    Right Eye Near:   Left Eye Near:    Bilateral Near:     Physical Exam  Constitutional: He is oriented to person, place, and time. He appears well-developed and well-nourished. No distress.  HENT:  Poor dental repair. Bilateral lower molars are all eroded to the level or below the level of the gums. The particular tooth is painful today is the second molar in the left lower jaw. Positive for dental tenderness. No evidence of infection or abscess. Most of the teeth are cavernous, denuded or rotted.  Neck: Neck supple.    Neurological: He is alert and oriented to person, place, and time.  Skin: Skin is warm and dry.  Nursing note and vitals reviewed.    UC Treatments / Results  Labs (all labs ordered are listed, but only abnormal results are displayed) Labs Reviewed - No data to display  EKG  EKG Interpretation None       Radiology No results found.  Procedures Procedures (including critical care time)  Medications Ordered in UC Medications - No data to display   Initial Impression / Assessment and Plan / UC Course  I have reviewed the triage vital signs and the nursing notes.  Pertinent labs & imaging results that were available during my care of the patient were reviewed by me and considered in my medical decision making (see chart for details).     Take the medication as directed. Go to the drugstore or [employer] to the dental repair center and look for some of the kits that can cover the teeth to decrease sensitivity and pain. You must see a dentist as soon as possible.   Final Clinical Impressions(s) / UC Diagnoses   Final diagnoses:  Pain, dental  Dental caries    New Prescriptions New Prescriptions   AMOXICILLIN (AMOXIL) 500 MG CAPSULE    Take 2 capsules (1,000 mg total) by mouth 2 (two) times daily.   DICLOFENAC (CATAFLAM) 50 MG TABLET    Take 1 tablet (50 mg total) by mouth 3 (three) times daily. One tablet TID with food prn pain.   TRAMADOL (ULTRAM) 50 MG TABLET    Take 1 tablet (50 mg total) by mouth every 6 (six) hours as needed.     Controlled Substance Prescriptions [place] Controlled Substance Registry consulted? Yes, I have consulted the [place] Controlled Substances Registry for this patient, and feel the risk/benefit ratio today is favorable for proceeding with this prescription for a controlled substance.   Janne Napoleon, NP 10/10/16 1735

## 2016-10-10 NOTE — ED Triage Notes (Signed)
Left, bottom tooth broke off two years ago.  This episode of pain started yesterday

## 2016-10-10 NOTE — Discharge Instructions (Signed)
Take the medication as directed. Go to the drugstore or [employer] to the dental repair center and look for some of the kits that can cover the teeth to decrease sensitivity and pain. You must see a dentist as soon as possible.

## 2016-12-01 ENCOUNTER — Ambulatory Visit (HOSPITAL_COMMUNITY)
Admission: EM | Admit: 2016-12-01 | Discharge: 2016-12-01 | Disposition: A | Discharge status: Home / Self Care | Payer: Self-pay | Attending: Family Medicine | Admitting: Family Medicine

## 2016-12-01 ENCOUNTER — Encounter (HOSPITAL_COMMUNITY): Payer: Self-pay | Admitting: Emergency Medicine

## 2016-12-01 DIAGNOSIS — R238 Other skin changes: Secondary | ICD-10-CM

## 2016-12-01 NOTE — ED Triage Notes (Signed)
The patient presented to the [facility] with multiple blisters on his back that he noticed today.

## 2016-12-07 NOTE — ED Provider Notes (Signed)
  [facility]   [id number] 12/01/16 Arrival Time: 9826  ASSESSMENT & PLAN:  1. Blisters of multiple sites    No sign of infection. Observation only. Unclear of exact etiology. Will f/u if worsening.  Reviewed expectations re: course of current medical issues. Questions answered. Outlined signs and symptoms indicating need for more acute intervention. Patient verbalized understanding. After Visit Summary given.   SUBJECTIVE:  Mitchell Romero is a 31 y.o. male who reports noticing several blisters on his back this morning. No pain. Unsure of cause. Afebrile. A few have "popped" with clear drainage. No self or OTC treatment. No h/o similar. No PMH of skin dz. No new exposures/products.  ROS: As per HPI.  OBJECTIVE:  Vitals:   12/01/16 1907  BP: 135/85  Pulse: 96  Resp: 18  Temp: 98.9 F (37.2 C)  TempSrc: Oral  SpO2: 97%     General appearance: alert; no distress Skin: upper back with several blisters bilaterally and randomly spaced; no sign of infection; largest blisters approx 1 cm Psychological:  alert and cooperative; normal mood and affect   Allergies  Allergen Reactions  . Peanut-Containing Drug Products Anaphylaxis  . Food Other (See Comments)    Tomatoes cause acid reflux Orange Juice causes vomiting  . Latex Hives  . Powder Rash    Powder inside gloves    Past Medical History:  Diagnosis Date  . Asthma   . Hypertension    PCP took off HTN meds            Vanessa Kick, MD 12/07/16 [phone]

## 2017-02-05 ENCOUNTER — Encounter (HOSPITAL_COMMUNITY): Payer: Self-pay

## 2017-02-05 ENCOUNTER — Other Ambulatory Visit: Payer: Self-pay

## 2017-02-05 ENCOUNTER — Emergency Department (HOSPITAL_COMMUNITY)
Admission: EM | Admit: 2017-02-05 | Discharge: 2017-02-05 | Disposition: A | Discharge status: Home / Self Care | Payer: Self-pay | Attending: Emergency Medicine | Admitting: Emergency Medicine

## 2017-02-05 DIAGNOSIS — R21 Rash and other nonspecific skin eruption: Secondary | ICD-10-CM | POA: Insufficient documentation

## 2017-02-05 DIAGNOSIS — J45909 Unspecified asthma, uncomplicated: Secondary | ICD-10-CM | POA: Insufficient documentation

## 2017-02-05 DIAGNOSIS — I10 Essential (primary) hypertension: Secondary | ICD-10-CM | POA: Insufficient documentation

## 2017-02-05 DIAGNOSIS — Z91010 Allergy to peanuts: Secondary | ICD-10-CM | POA: Insufficient documentation

## 2017-02-05 DIAGNOSIS — Z91040 Latex allergy status: Secondary | ICD-10-CM | POA: Insufficient documentation

## 2017-02-05 DIAGNOSIS — F17210 Nicotine dependence, cigarettes, uncomplicated: Secondary | ICD-10-CM | POA: Insufficient documentation

## 2017-02-05 MED ORDER — PREDNISONE 20 MG PO TABS: 40.0000 mg | ORAL_TABLET | Freq: Every day | ORAL | 0 refills | Status: DC

## 2017-02-05 NOTE — ED Triage Notes (Signed)
Patient complains of itchy rash with burning to bilateral arms x 1 week. Denies any known exposures, NAD

## 2017-02-05 NOTE — Discharge Instructions (Signed)
It was my pleasure taking care of you today!   Take prednisone daily beginning this morning. Benadryl every 6 hours as needed for itching. Please follow up with your primary doctor in 2-3 days for discussion of your diagnoses and further evaluation after today's visit. Return to the ER for difficulty breathing, fever, new or worsening symptoms, any additional concerns.

## 2017-02-05 NOTE — ED Provider Notes (Signed)
[facility] EM[facility]PART[facility] Provider Note   CSN: [id number] Arrival date & time: 02/05/17  5277     History   Chief Complaint No chief complaint on file.   HPI Mitchell Romero is a 31 y.o. male.  The history is provided by the patient and medical records. No language interpreter was used.   Mitchell Romero is a 31 y.o. male with PMH of asthma who presents to ED for pruritic rash to bilateral upper extremities which began two weeks ago.  Not painful. Described as small little white or skin colored bumps.  Patient tried Benadryl for the itching with adequate relief.  He tried hydrocortisone cream which she will use for his eczema, however this has not helped with the rash.  No new medications.  No recent travel/camping.  No known triggers or exposures.  No new soaps, lotions, detergents, colognes, etc.  No contacts with similar symptoms.  No history of similar symptoms.  No fever chills, shortness of breath, oral swelling, sore throat, cough, abdominal pain, n/v/d, arthralgias, visual changes, redness or discharge from the eyes.  Past Medical History:  Diagnosis Date  . Asthma   . Hypertension    PCP took off HTN meds    There are no active problems to display for this patient.   History reviewed. No pertinent surgical history.     Home Medications    Prior to Admission medications   Medication Sig Start Date End Date Taking? Authorizing Provider  predniSONE (DELTASONE) 20 MG tablet Take 2 tablets (40 mg total) by mouth daily. 02/05/17   Zana Biancardi, Ozella Almond, PA-C    Family History No family history on file.  Social History Social History   Tobacco Use  . Smoking status: Current Every Day Smoker    Packs/day: 0.20    Types: Cigarettes  . Smokeless tobacco: Never Used  Substance Use Topics  . Alcohol use: Yes    Comment: occasional  . Drug use: No     Allergies   Peanut-containing drug products; Food; Latex; and Powder   Review of  Systems Review of Systems  Constitutional: Negative for chills and fever.  HENT: Negative for sore throat and trouble swallowing.   Eyes: Negative for discharge, redness and itching.  Respiratory: Negative for cough and shortness of breath.   Gastrointestinal: Negative for abdominal pain, diarrhea, nausea and vomiting.  Musculoskeletal: Negative for arthralgias and myalgias.  Skin: Positive for rash.  Neurological: Negative for weakness.     Physical Exam Updated Vital Signs BP 121/76   Pulse 76   Temp 97.8 F (36.6 C) (Oral)   Resp 16   SpO2 100%   Physical Exam  Constitutional: He is oriented to person, place, and time. He appears well-developed and well-nourished. No distress.  Afebrile, non-toxic appearing.  HENT:  Head: Normocephalic and atraumatic.  No oral lesions. Patent airway.  Eyes: Conjunctivae are normal. Right eye exhibits no discharge. Left eye exhibits no discharge.  Cardiovascular: Normal rate, regular rhythm and normal heart sounds.  Pulmonary/Chest: Effort normal and breath sounds normal. No respiratory distress. He has no wheezes. He has no rales.  Musculoskeletal: Normal range of motion. He exhibits no tenderness.  Neurological: He is alert and oriented to person, place, and time.  Skin: Skin is warm and dry. Capillary refill takes less than 2 seconds.  Bilateral forearms with diffuse papular rash. No abscesses. No erythema or warmth. Not tender to the touch. No lesions to the palms or soles.  No petechiae, purpura, bulla, desquamation or target lesions.  Nursing note and vitals reviewed.    ED Treatments / Results  Labs (all labs ordered are listed, but only abnormal results are displayed) Labs Reviewed  RPR  HIV ANTIBODY (ROUTINE TESTING)    EKG  EKG Interpretation None       Radiology No results found.  Procedures Procedures (including critical care time)  Medications Ordered in ED Medications - No data to display   Initial  Impression / Assessment and Plan / ED Course  I have reviewed the triage vital signs and the nursing notes.  Pertinent labs & imaging results that were available during my care of the patient were reviewed by me and considered in my medical decision making (see chart for details).    Mitchell Romero is a 30 y.o. male who presents to ED for bilateral upper extremity rash.  Patient denies any difficulty breathing or swallowing. Patientt has a patent airway without stridor and is handling secretions without difficulty; no angioedema. No blisters, pustules, warmth, abscesses, bullous impetigo, vesicles, desquamation or target lesions. Rash is not tender to the touch. No concern for superimposed infection. No concern for SJS, TEN, TSS, tick borne illness or other life-threatening condition. Patient would like to get tested for HIV and syphilis which was done. Patient aware he will be notified if results are positive. Will discharge home with short course of steroids and recommend Benadryl as needed for pruritis. PCP follow up strongly encouraged. Reasons to return to ER discussed and all questions answered.   Final Clinical Impressions(s) / ED Diagnoses   Final diagnoses:  Rash    ED Discharge Orders        Ordered    predniSONE (DELTASONE) 20 MG tablet  Daily     02/05/17 0921       Javonn Gauger, Ozella Almond, PA-C 02/05/17 5681    Lacretia Leigh, MD 02/05/17 [phone]

## 2017-02-06 LAB — RPR: RPR: NONREACTIVE

## 2017-02-06 LAB — HIV ANTIBODY (ROUTINE TESTING W REFLEX): HIV Screen 4th Generation wRfx: NONREACTIVE

## 2017-02-28 ENCOUNTER — Emergency Department (HOSPITAL_COMMUNITY)
Admission: EM | Admit: 2017-02-28 | Discharge: 2017-02-28 | Disposition: A | Discharge status: Home / Self Care | Payer: Self-pay | Attending: Emergency Medicine | Admitting: Emergency Medicine

## 2017-02-28 ENCOUNTER — Other Ambulatory Visit: Payer: Self-pay

## 2017-02-28 ENCOUNTER — Encounter (HOSPITAL_COMMUNITY): Payer: Self-pay | Admitting: Emergency Medicine

## 2017-02-28 DIAGNOSIS — K640 First degree hemorrhoids: Secondary | ICD-10-CM | POA: Insufficient documentation

## 2017-02-28 DIAGNOSIS — I10 Essential (primary) hypertension: Secondary | ICD-10-CM | POA: Insufficient documentation

## 2017-02-28 DIAGNOSIS — Z91040 Latex allergy status: Secondary | ICD-10-CM | POA: Insufficient documentation

## 2017-02-28 DIAGNOSIS — J45909 Unspecified asthma, uncomplicated: Secondary | ICD-10-CM | POA: Insufficient documentation

## 2017-02-28 DIAGNOSIS — Z91010 Allergy to peanuts: Secondary | ICD-10-CM | POA: Insufficient documentation

## 2017-02-28 DIAGNOSIS — Z79899 Other long term (current) drug therapy: Secondary | ICD-10-CM | POA: Insufficient documentation

## 2017-02-28 DIAGNOSIS — F17210 Nicotine dependence, cigarettes, uncomplicated: Secondary | ICD-10-CM | POA: Insufficient documentation

## 2017-02-28 MED ORDER — IBUPROFEN 800 MG PO TABS
800.0000 mg | ORAL_TABLET | Freq: Once | ORAL | Status: AC
  Administered 2017-02-28: 800 mg via ORAL
  Filled 2017-02-28: qty 1

## 2017-02-28 MED ORDER — HYDROCODONE-ACETAMINOPHEN 5-325 MG PO TABS: 2.0000 | ORAL_TABLET | ORAL | 0 refills | Status: DC | PRN

## 2017-02-28 MED ORDER — ALBUTEROL SULFATE HFA 108 (90 BASE) MCG/ACT IN AERS: 2.0000 | INHALATION_SPRAY | RESPIRATORY_TRACT | 0 refills | Status: DC | PRN

## 2017-02-28 MED ORDER — HYDROCORTISONE 2.5 % RE CREA: TOPICAL_CREAM | RECTAL | 0 refills | Status: DC

## 2017-02-28 MED ORDER — DOCUSATE SODIUM 100 MG PO CAPS: 100.0000 mg | ORAL_CAPSULE | Freq: Two times a day (BID) | ORAL | 0 refills | Status: DC

## 2017-02-28 NOTE — ED Triage Notes (Signed)
Pt states for the last 2 days he has been having rectal pain with bright red bleeding with bowel movement. Pt feels like something is coming out of rectum and a burning pain.

## 2017-02-28 NOTE — ED Provider Notes (Signed)
[facility] EMERGENC[facility] DEPARTMENT Provider Note   CSN: [id number] Arrival date & time: 02/28/17  0640     History   Chief Complaint Chief Complaint  Patient presents with  . Rectal Pain    HPI Mitchell Romero is a 32 y.o. male.  The history is provided by the patient. No language interpreter was used.  Rectal Bleeding  Amount:  Moderate Timing:  Constant Chronicity:  New Context: hemorrhoids   Context: not anal fissures   Similar prior episodes: no   Relieved by:  Nothing Worsened by:  Nothing Ineffective treatments:  None tried Associated symptoms: no abdominal pain   Risk factors: no NSAID use   Pt complains of bleeding from hemorrhoids.  Past Medical History:  Diagnosis Date  . Asthma   . Hypertension    PCP took off HTN meds    There are no active problems to display for this patient.   History reviewed. No pertinent surgical history.     Home Medications    Prior to Admission medications   Medication Sig Start Date End Date Taking? Authorizing Provider  albuterol (PROVENTIL HFA;VENTOLIN HFA) 108 (90 Base) MCG/ACT inhaler Inhale 2 puffs into the lungs every 4 (four) hours as needed for wheezing or shortness of breath. 02/28/17   Fransico Meadow, PA-C  docusate sodium (COLACE) 100 MG capsule Take 1 capsule (100 mg total) by mouth every 12 (twelve) hours. 02/28/17   Fransico Meadow, PA-C  HYDROcodone-acetaminophen (NORCO/VICODIN) 5-325 MG tablet Take 2 tablets by mouth every 4 (four) hours as needed. 02/28/17   Fransico Meadow, PA-C  hydrocortisone (ANUSOL-HC) 2.5 % rectal cream Apply rectally 2 times daily 02/28/17   Fransico Meadow, PA-C  predniSONE (DELTASONE) 20 MG tablet Take 2 tablets (40 mg total) by mouth daily. 02/05/17   Ward, Ozella Almond, PA-C    Family History No family history on file.  Social History Social History   Tobacco Use  . Smoking status: Current Every Day Smoker    Packs/day: 0.20    Types: Cigarettes  . Smokeless  tobacco: Never Used  Substance Use Topics  . Alcohol use: Yes    Comment: occasional  . Drug use: No     Allergies   Peanut-containing drug products; Food; Latex; and Powder   Review of Systems Review of Systems  Gastrointestinal: Positive for hematochezia. Negative for abdominal pain.  All other systems reviewed and are negative.    Physical Exam Updated Vital Signs BP 118/87   Pulse 88   Temp 98 F (36.7 C) (Oral)   Resp 16   SpO2 100%   Physical Exam  Constitutional: He appears well-developed and well-nourished.  HENT:  Head: Normocephalic and atraumatic.  Eyes: Conjunctivae are normal.  Neck: Neck supple.  Cardiovascular: Normal rate and regular rhythm.  No murmur heard. Pulmonary/Chest: Effort normal and breath sounds normal. No respiratory distress.  Abdominal: Soft. There is no tenderness.  Genitourinary:  Genitourinary Comments: Small external hemorrhoid  Musculoskeletal: He exhibits no edema.  Neurological: He is alert.  Skin: Skin is warm and dry.  Psychiatric: He has a normal mood and affect.  Nursing note and vitals reviewed.    ED Treatments / Results  Labs (all labs ordered are listed, but only abnormal results are displayed) Labs Reviewed - No data to display  EKG  EKG Interpretation None       Radiology No results found.  Procedures Procedures (including critical care time)  Medications Ordered in  ED Medications  ibuprofen (ADVIL,MOTRIN) tablet 800 mg (800 mg Oral Given 02/28/17 1025)     Initial Impression / Assessment and Plan / ED Course  I have reviewed the triage vital signs and the nursing notes.  Pertinent labs & imaging results that were available during my care of the patient were reviewed by me and considered in my medical decision making (see chart for details).     Pt counseled on hemorrhoids,   Final Clinical Impressions(s) / ED Diagnoses   Final diagnoses:  Grade I hemorrhoids    ED Discharge Orders         Ordered    hydrocortisone (ANUSOL-HC) 2.5 % rectal cream     02/28/17 1009    HYDROcodone-acetaminophen (NORCO/VICODIN) 5-325 MG tablet  Every 4 hours PRN     02/28/17 1009    albuterol (PROVENTIL HFA;VENTOLIN HFA) 108 (90 Base) MCG/ACT inhaler  Every 4 hours PRN     02/28/17 1009    docusate sodium (COLACE) 100 MG capsule  Every 12 hours     02/28/17 1010    An After Visit Summary was printed and given to the patient.    Fransico Meadow, [place] 02/28/17 1136    Dorie Rank, MD 02/28/17 [phone]

## 2017-03-03 ENCOUNTER — Other Ambulatory Visit: Payer: Self-pay

## 2017-03-03 ENCOUNTER — Encounter (HOSPITAL_COMMUNITY): Payer: Self-pay | Admitting: Emergency Medicine

## 2017-03-03 ENCOUNTER — Emergency Department (HOSPITAL_COMMUNITY)
Admission: EM | Admit: 2017-03-03 | Discharge: 2017-03-03 | Disposition: A | Discharge status: Home / Self Care | Payer: Self-pay | Attending: Emergency Medicine | Admitting: Emergency Medicine

## 2017-03-03 ENCOUNTER — Emergency Department (HOSPITAL_COMMUNITY): Payer: Self-pay

## 2017-03-03 DIAGNOSIS — I10 Essential (primary) hypertension: Secondary | ICD-10-CM | POA: Insufficient documentation

## 2017-03-03 DIAGNOSIS — Z91040 Latex allergy status: Secondary | ICD-10-CM | POA: Insufficient documentation

## 2017-03-03 DIAGNOSIS — Z91010 Allergy to peanuts: Secondary | ICD-10-CM | POA: Insufficient documentation

## 2017-03-03 DIAGNOSIS — K6289 Other specified diseases of anus and rectum: Secondary | ICD-10-CM | POA: Insufficient documentation

## 2017-03-03 DIAGNOSIS — F17210 Nicotine dependence, cigarettes, uncomplicated: Secondary | ICD-10-CM | POA: Insufficient documentation

## 2017-03-03 DIAGNOSIS — Z79899 Other long term (current) drug therapy: Secondary | ICD-10-CM | POA: Insufficient documentation

## 2017-03-03 DIAGNOSIS — J45909 Unspecified asthma, uncomplicated: Secondary | ICD-10-CM | POA: Insufficient documentation

## 2017-03-03 LAB — I-STAT CHEM 8, ED
BUN: 9 mg/dL (ref 6–20)
CALCIUM ION: 1.12 mmol/L — AB (ref 1.15–1.40)
Chloride: 104 mmol/L (ref 101–111)
Creatinine, Ser: 0.7 mg/dL (ref 0.61–1.24)
GLUCOSE: 113 mg/dL — AB (ref 65–99)
HCT: 47 % (ref 39.0–52.0)
Hemoglobin: 16 g/dL (ref 13.0–17.0)
Potassium: 4.2 mmol/L (ref 3.5–5.1)
SODIUM: 139 mmol/L (ref 135–145)
TCO2: 24 mmol/L (ref 22–32)

## 2017-03-03 LAB — CBC WITH DIFFERENTIAL/PLATELET
Basophils Absolute: 0 10*3/uL (ref 0.0–0.1)
Basophils Relative: 0 %
EOS ABS: 0 10*3/uL (ref 0.0–0.7)
EOS PCT: 0 %
HCT: 45.2 % (ref 39.0–52.0)
Hemoglobin: 15.6 g/dL (ref 13.0–17.0)
LYMPHS ABS: 2.2 10*3/uL (ref 0.7–4.0)
LYMPHS PCT: 18 %
MCH: 31.3 pg (ref 26.0–34.0)
MCHC: 34.5 g/dL (ref 30.0–36.0)
MCV: 90.6 fL (ref 78.0–100.0)
MONO ABS: 1.1 10*3/uL — AB (ref 0.1–1.0)
MONOS PCT: 9 %
Neutro Abs: 8.9 10*3/uL — ABNORMAL HIGH (ref 1.7–7.7)
Neutrophils Relative %: 73 %
PLATELETS: 228 10*3/uL (ref 150–400)
RBC: 4.99 MIL/uL (ref 4.22–5.81)
RDW: 12.6 % (ref 11.5–15.5)
WBC: 12.2 10*3/uL — ABNORMAL HIGH (ref 4.0–10.5)

## 2017-03-03 LAB — POC OCCULT BLOOD, ED: FECAL OCCULT BLD: POSITIVE — AB

## 2017-03-03 MED ORDER — VALACYCLOVIR HCL 1 G PO TABS: 1000.0000 mg | ORAL_TABLET | Freq: Two times a day (BID) | ORAL | 0 refills | Status: DC

## 2017-03-03 MED ORDER — MORPHINE SULFATE (PF) 4 MG/ML IV SOLN
4.0000 mg | Freq: Once | INTRAVENOUS | Status: AC
  Administered 2017-03-03: 4 mg via INTRAVENOUS
  Filled 2017-03-03: qty 1

## 2017-03-03 MED ORDER — OXYCODONE-ACETAMINOPHEN 5-325 MG PO TABS
1.0000 | ORAL_TABLET | ORAL | Status: DC | PRN
  Administered 2017-03-03: 1 via ORAL
  Filled 2017-03-03: qty 1

## 2017-03-03 MED ORDER — ONDANSETRON HCL 4 MG/2ML IJ SOLN
4.0000 mg | Freq: Once | INTRAMUSCULAR | Status: AC
  Administered 2017-03-03: 4 mg via INTRAVENOUS
  Filled 2017-03-03: qty 2

## 2017-03-03 MED ORDER — DOXYCYCLINE HYCLATE 100 MG PO TABS
100.0000 mg | ORAL_TABLET | Freq: Once | ORAL | Status: AC
  Administered 2017-03-03: 100 mg via ORAL
  Filled 2017-03-03: qty 1

## 2017-03-03 MED ORDER — LIDOCAINE HCL (PF) 1 % IJ SOLN
0.9000 mL | Freq: Once | INTRAMUSCULAR | Status: AC
  Administered 2017-03-03: 0.9 mL

## 2017-03-03 MED ORDER — LIDOCAINE HCL (PF) 1 % IJ SOLN
INTRAMUSCULAR | Status: AC
  Filled 2017-03-03: qty 5

## 2017-03-03 MED ORDER — IOPAMIDOL (ISOVUE-300) INJECTION 61%
INTRAVENOUS | Status: AC
  Administered 2017-03-03: 100 mL
  Filled 2017-03-03: qty 100

## 2017-03-03 MED ORDER — KETOROLAC TROMETHAMINE 30 MG/ML IJ SOLN
15.0000 mg | Freq: Once | INTRAMUSCULAR | Status: AC
  Administered 2017-03-03: 15 mg via INTRAVENOUS
  Filled 2017-03-03: qty 1

## 2017-03-03 MED ORDER — DOXYCYCLINE HYCLATE 100 MG PO CAPS: 100.0000 mg | ORAL_CAPSULE | Freq: Two times a day (BID) | ORAL | 0 refills | Status: DC

## 2017-03-03 MED ORDER — CEFTRIAXONE SODIUM 250 MG IJ SOLR
250.0000 mg | Freq: Once | INTRAMUSCULAR | Status: AC
  Administered 2017-03-03: 250 mg via INTRAMUSCULAR
  Filled 2017-03-03: qty 250

## 2017-03-03 MED ORDER — LIDOCAINE (ANORECTAL) 5 % EX GEL: CUTANEOUS | 0 refills | Status: DC

## 2017-03-03 NOTE — ED Notes (Signed)
ED Provider at bedside. 

## 2017-03-03 NOTE — ED Provider Notes (Signed)
[facility] EMERGENC[facility] DEPARTMENT Provider Note   CSN: [id number] Arrival date & time: 03/03/17  7026     History   Chief Complaint Chief Complaint  Patient presents with  . Hemorrhoids    HPI Mitchell Romero is a 32 y.o. male.  HPI 32 year old African-American male past medical history significant for asthma and hypertension presents to the emergency department today for evaluation of rectal pain and rectal bleeding.  Patient states that his symptoms started approximately 1 week ago.  States the pain is progressively worsening.  She reports some blood in his stool and pain with bowel movements.  He denies any melena.  She states he does have history of constipation and has been straining more lately.  Patient was seen in the ED on 1/7 for same symptoms.  At that time he was diagnosed with hemorrhoids and given hydrocortisone cream.  Patient states that he has been applying this in his rectum and it is not helping his pain.  Patient reports pain with sitting and prolonged standing.  Nothing makes his symptoms better.  He is not taking anything for the pain prior to arrival.  Patient does report anal intercourse with male 2 weeks ago.  Patient states that he does get tested for HIV and syphilis regularly and had a negative test on 12/15.  Patient denies any urinary symptoms including discharge or dysuria.  Denies any associated fever, chills, abdominal pain, nausea, emesis, testicular pain or swelling.  Patient denies any associated rashes, anus or testicles.  Patient does report some discharge from his retum that is new over the past 2 days.  Pt denies any fever, chill, ha, vision changes, lightheadedness, dizziness, congestion, neck pain, cp, sob, cough, abd pain, n/v/d, urinary symptoms, change in bowel habits, melena, lower extremity paresthesias.   Past Medical History:  Diagnosis Date  . Asthma   . Hypertension    PCP took off HTN meds    There are no active  problems to display for this patient.   History reviewed. No pertinent surgical history.     Home Medications    Prior to Admission medications   Medication Sig Start Date End Date Taking? Authorizing Provider  albuterol (PROVENTIL HFA;VENTOLIN HFA) 108 (90 Base) MCG/ACT inhaler Inhale 2 puffs into the lungs every 4 (four) hours as needed for wheezing or shortness of breath. 02/28/17  Yes Caryl Ada K, PA-C  docusate sodium (COLACE) 100 MG capsule Take 1 capsule (100 mg total) by mouth every 12 (twelve) hours. 02/28/17  Yes Fransico Meadow, PA-C  HYDROcodone-acetaminophen (NORCO/VICODIN) 5-325 MG tablet Take 2 tablets by mouth every 4 (four) hours as needed. 02/28/17  Yes Caryl Ada K, PA-C  hydrocortisone (ANUSOL-HC) 2.5 % rectal cream Apply rectally 2 times daily 02/28/17  Yes Caryl Ada K, PA-C  doxycycline (VIBRAMYCIN) 100 MG capsule Take 1 capsule (100 mg total) by mouth 2 (two) times daily. 03/03/17   Ocie Cornfield T, PA-C  Lidocaine, Anorectal, 5 % GEL Apply a pea-sized amount to the affected area 3 times daily. Do not apply inside the rectum. 03/03/17   Doristine Devoid, PA-C  predniSONE (DELTASONE) 20 MG tablet Take 2 tablets (40 mg total) by mouth daily. Patient not taking: Reported on 03/03/2017 02/05/17   Ward, Ozella Almond, PA-C  valACYclovir (VALTREX) 1000 MG tablet Take 1 tablet (1,000 mg total) by mouth 2 (two) times daily. 03/03/17   Doristine Devoid, PA-C    Family History History reviewed. No pertinent family  history.  Social History Social History   Tobacco Use  . Smoking status: Current Every Day Smoker    Packs/day: 0.20    Types: Cigarettes  . Smokeless tobacco: Never Used  Substance Use Topics  . Alcohol use: Yes    Comment: occasional  . Drug use: No     Allergies   Peanut-containing drug products; Food; Latex; and Powder   Review of Systems Review of Systems  Constitutional: Negative for chills and fever.  HENT: Negative for congestion  and sore throat.   Eyes: Negative for visual disturbance.  Respiratory: Negative for cough and shortness of breath.   Cardiovascular: Negative for chest pain.  Gastrointestinal: Positive for blood in stool, constipation and rectal pain. Negative for abdominal pain, diarrhea, nausea and vomiting.  Genitourinary: Negative for dysuria, flank pain, frequency, hematuria, scrotal swelling, testicular pain and urgency.  Musculoskeletal: Negative for arthralgias and myalgias.  Skin: Negative for rash.  Neurological: Negative for dizziness, syncope, weakness, light-headedness, numbness and headaches.  Psychiatric/Behavioral: Negative for sleep disturbance. The patient is not nervous/anxious.      Physical Exam Updated Vital Signs BP 134/87   Pulse 79   Temp 98.5 F (36.9 C) (Oral)   Resp 20   SpO2 100%   Physical Exam  Constitutional: He is oriented to person, place, and time. He appears well-developed and well-nourished.  Non-toxic appearance. No distress.  HENT:  Head: Normocephalic and atraumatic.  Mouth/Throat: Oropharynx is clear and moist.  Eyes: Conjunctivae are normal. Pupils are equal, round, and reactive to light. Right eye exhibits no discharge. Left eye exhibits no discharge.  Neck: Normal range of motion. Neck supple.  Cardiovascular: Normal rate, regular rhythm, normal heart sounds and intact distal pulses. Exam reveals no gallop and no friction rub.  No murmur heard. Pulmonary/Chest: Effort normal.  Abdominal: Soft. Bowel sounds are normal. He exhibits no distension. There is no tenderness. There is no rebound and no guarding.  Genitourinary:  Genitourinary Comments: Chaperone present for exam. Pt tolerated without difficulty. No external hemorrhoids or fissures noted.pain with palpation of the rectal vault.  No erythema or area of fluctuance.  No discharge noted.  No internal hemorrhoids noted. Soft brown stool noted in the rectal vault. No gross hematochezia or melena.  Hemoccult positive.    Musculoskeletal: Normal range of motion. He exhibits no tenderness.  Lymphadenopathy:    He has no cervical adenopathy.  Neurological: He is alert and oriented to person, place, and time.  Skin: Skin is warm and dry. Capillary refill takes less than 2 seconds. No rash noted.  Psychiatric: His behavior is normal. Judgment and thought content normal.  Nursing note and vitals reviewed.    ED Treatments / Results  Labs (all labs ordered are listed, but only abnormal results are displayed) Labs Reviewed  CBC WITH DIFFERENTIAL/PLATELET - Abnormal; Notable for the following components:      Result Value   WBC 12.2 (*)    Neutro Abs 8.9 (*)    Monocytes Absolute 1.1 (*)    All other components within normal limits  I-STAT CHEM 8, ED - Abnormal; Notable for the following components:   Glucose, Bld 113 (*)    Calcium, Ion 1.12 (*)    All other components within normal limits  POC OCCULT BLOOD, ED - Abnormal; Notable for the following components:   Fecal Occult Bld POSITIVE (*)    All other components within normal limits  HSV CULTURE AND TYPING  RPR  HIV ANTIBODY (ROUTINE TESTING)  GC/CHLAMYDIA PROBE AMP ([place]) NOT AT [facility]    EKG  EKG Interpretation None       Radiology Ct Pelvis W Contrast  Result Date: 03/03/2017 CLINICAL DATA:  Perirectal pain and bleeding.  Reported hemorrhoids EXAM: CT PELVIS WITH CONTRAST TECHNIQUE: Multidetector CT imaging of the pelvis was performed using the standard protocol following the bolus administration of intravenous contrast. CONTRAST:  191mL ISOVUE-300 IOPAMIDOL (ISOVUE-300) INJECTION 61% COMPARISON:  CT abdomen and pelvis June 04, 2012 FINDINGS: Urinary Tract: Urinary bladder is midline with wall thickness within normal limits. Visualized ureters appear unremarkable. Bowel: There is soft tissue stranding in the perirectal region with mildly prominent perirectal vascular structures. There is no well-defined mass or  abnormal fluid collection in the perirectal/perianal region. No abscess evident. Soft tissue stranding is also noted along the upper region without abnormal fluid or abscess. Elsewhere, visualized bowel appears normal. No evident bowel obstruction. No free air is appreciable. Vascular/Lymphatic: Visualized vascular structures appear normal. No adenopathy is appreciable in the pelvis. Subcentimeter inguinal lymph nodes are considered nonspecific. Reproductive: Prostate and seminal vesicles appear normal in size and contour. No pelvic mass evident. Other: Appendix appears normal. No ascites or abscess evident in the pelvis. Musculoskeletal: No blastic or lytic bone lesions. No intramuscular or abdominal wall lesion evident. IMPRESSION: 1. There is perirectal soft tissue stranding consistent with a degree of proctitis. Note that the rectal wall does not appear appreciably thickened. Mild prominence of vascular structures in the perirectal region likely represent hemorrhoidal tissue. 2. No perirectal or perianal abscess. No evidence of abscess elsewhere in the pelvis. 3. No bowel obstruction. No abnormal fluid collection. No ascites. 4.  Study otherwise unremarkable. Electronically Signed   By: Lowella Grip III M.D.   On: 03/03/2017 09:38    Procedures Procedures (including critical care time)  Medications Ordered in ED Medications  oxyCODONE-acetaminophen (PERCOCET/ROXICET) 5-325 MG per tablet 1 tablet (1 tablet Oral Given 03/03/17 0329)  morphine 4 MG/ML injection 4 mg (4 mg Intravenous Given 03/03/17 0706)  ondansetron (ZOFRAN) injection 4 mg (4 mg Intravenous Given 03/03/17 0705)  iopamidol (ISOVUE-300) 61 % injection (100 mLs  Contrast Given 03/03/17 0920)  ketorolac (TORADOL) 30 MG/ML injection 15 mg (15 mg Intravenous Given 03/03/17 1037)  cefTRIAXone (ROCEPHIN) injection 250 mg (250 mg Intramuscular Given 03/03/17 1058)  doxycycline (VIBRA-TABS) tablet 100 mg (100 mg Oral Given 03/03/17 1056)    lidocaine (PF) (XYLOCAINE) 1 % injection 0.9 mL (0.9 mLs Other Given 03/03/17 1057)     Initial Impression / Assessment and Plan / ED Course  I have reviewed the triage vital signs and the nursing notes.  Pertinent labs & imaging results that were available during my care of the patient were reviewed by me and considered in my medical decision making (see chart for details).     Patient presents to the ED for evaluation of ongoing rectal pain and rectal bleeding.  Patient is sexually active with a man and does have anal intercourse 2 weeks ago.  Patient reports some discharge from his rectum as well.  Patient was seen 2 days ago in the ED for same symptoms and diagnosed with a hemorrhoid at that time.  Patient was started on hydrocortisone cream that is not helping.  Patient denies any associated abdominal pain, urinary symptoms, fevers, chills.  Does report some blood in his stool at times.  Patient is overall well-appearing and nontoxic.  He does appear uncomfortable in his rectal region.  Rectal exam reveals no  external hemorrhoids.  He has no signs of a perirectal abscess.  The patient has no associated lesions around the rectum.  He does have significant pain to palpation with digital exam.  The patient has no gross melena or hematochezia.  He is Hemoccult positive.  Patient has no abdominal tenderness to palpation.  Heart regular rate and rhythm.  Lab work reveals a leukocytosis of 12,000.  Electrolytes appear reassuring.  Normal kidney function.  Hemoccult is positive.  CT scan was ordered of the pelvis to rule out any perirectal abscess.  IMPRESSION: 1. There is perirectal soft tissue stranding consistent with a degree of proctitis. Note that the rectal wall does not appear appreciably thickened. Mild prominence of vascular structures in the perirectal region likely represent hemorrhoidal tissue. 2. No perirectal or perianal abscess. No evidence of abscess elsewhere in the pelvis. 3. No  bowel obstruction. No abnormal fluid collection. No ascites. 4.  Study otherwise unremarkable. Electronically Signed   By: Lowella Grip III M.D.   On: 03/03/2017 09:38   Given CT findings with patient's high risk behavior will treat patient with Rocephin and doxycycline to cover for gonorrhea and chlamydia.  These cultures are pending.  Have also sent for culture for HSV.  Will start patient on antiviral medications as well including valacyclovir.  Patient does not have any signs of acute blood loss from rectal bleeding.  No further episodes in the ED.  Abdominal exam is benign without any signs of focal abdominal tenderness or peritonitis.  Patient is afebrile.  Patient does get screened for HIV and syphilis regularly.  History of syphilis in the past however he had negative syphilis and HIV testings on 02/05/17.  Will send for HIV and syphilis at this time which are pending.  Patient will be notified of findings.  His pain was managed in the ED.  He appears much more comfortable at this time and ready for discharge.  Patient will need close follow-up with PCP and GI.  Have given referral.  Pt is hemodynamically stable, in NAD, & able to ambulate in the ED. Evaluation does not show pathology that would require ongoing emergent intervention or inpatient treatment. I explained the diagnosis to the patient. Pain has been managed & has no complaints prior to dc. Pt is comfortable with above plan and is stable for discharge at this time. All questions were answered prior to disposition. Strict return precautions for f/u to the ED were discussed. Encouraged follow up with PCP.  Dicussed with Dr. Maryan Rued who is agreeable with the above plan.     Final Clinical Impressions(s) / ED Diagnoses   Final diagnoses:  Proctitis    ED Discharge Orders        Ordered    doxycycline (VIBRAMYCIN) 100 MG capsule  2 times daily     03/03/17 1118    valACYclovir (VALTREX) 1000 MG tablet  2 times daily      03/03/17 1118    Lidocaine, Anorectal, 5 % GEL     03/03/17 1118       Doristine Devoid, PA-C 03/03/17 1134    Blanchie Dessert, MD 03/03/17 2156

## 2017-03-03 NOTE — ED Triage Notes (Signed)
Pt presents with ongoing pain related to hemorrhoids; some bleeding persists; pt visibly uncomfortable; cannot sit square on buttocks

## 2017-03-03 NOTE — Discharge Instructions (Signed)
Your CT scan does show signs of inflammation of your rectum.  Please start taking the antibiotic as prescribed.  I also given you a valacyclovir which is antiviral medication.  All of your cultures and test results are pending at this time and will be notified if they are positive.  You have been treated for an STD and should inform all sexual partners that you have been treated.  Have given you numbing cream to use on the outside of your anus but do not place and side of the rectum.  Avoid any sexual contact for 14 days.  You also need follow-up with a GI doctor have given you a referral.  If you start developing worsening pain, fevers, worsening bleeding, abdominal pain return to the ED immediately.

## 2017-03-04 LAB — GC/CHLAMYDIA PROBE AMP (~~LOC~~) NOT AT ARMC
CHLAMYDIA, DNA PROBE: NEGATIVE
NEISSERIA GONORRHEA: NEGATIVE

## 2017-03-04 LAB — RPR, QUANT+TP ABS (REFLEX)
Rapid Plasma Reagin, Quant: 1:1 {titer} — ABNORMAL HIGH
TREPONEMA PALLIDUM AB: POSITIVE — AB

## 2017-03-04 LAB — RPR: RPR Ser Ql: REACTIVE — AB

## 2017-03-05 ENCOUNTER — Encounter (HOSPITAL_COMMUNITY): Payer: Self-pay | Admitting: Nurse Practitioner

## 2017-03-05 ENCOUNTER — Emergency Department (HOSPITAL_COMMUNITY)
Admission: EM | Admit: 2017-03-05 | Discharge: 2017-03-05 | Disposition: A | Discharge status: Home / Self Care | Payer: Self-pay | Attending: Emergency Medicine | Admitting: Emergency Medicine

## 2017-03-05 DIAGNOSIS — Z5321 Procedure and treatment not carried out due to patient leaving prior to being seen by health care provider: Secondary | ICD-10-CM | POA: Insufficient documentation

## 2017-03-05 DIAGNOSIS — K648 Other hemorrhoids: Secondary | ICD-10-CM | POA: Insufficient documentation

## 2017-03-05 LAB — HIV ANTIBODY (ROUTINE TESTING W REFLEX): HIV SCREEN 4TH GENERATION: NONREACTIVE

## 2017-03-05 NOTE — ED Notes (Signed)
Patient called 3 times for room

## 2017-03-05 NOTE — ED Notes (Signed)
Pt left facility, removing pt from system.

## 2017-03-05 NOTE — ED Triage Notes (Signed)
Pt is requesting another evaluation of the hemorrhoid problem he has been having, adds that he does not have the money to fill the prescription he received last visit.

## 2017-03-05 NOTE — ED Notes (Signed)
Patient called twice for room

## 2017-03-06 ENCOUNTER — Other Ambulatory Visit: Payer: Self-pay

## 2017-03-06 ENCOUNTER — Encounter (HOSPITAL_COMMUNITY): Payer: Self-pay | Admitting: *Deleted

## 2017-03-06 ENCOUNTER — Emergency Department (HOSPITAL_COMMUNITY)
Admission: EM | Admit: 2017-03-06 | Discharge: 2017-03-06 | Disposition: A | Discharge status: Home / Self Care | Payer: Self-pay | Attending: Emergency Medicine | Admitting: Emergency Medicine

## 2017-03-06 DIAGNOSIS — Z79899 Other long term (current) drug therapy: Secondary | ICD-10-CM | POA: Insufficient documentation

## 2017-03-06 DIAGNOSIS — K6289 Other specified diseases of anus and rectum: Secondary | ICD-10-CM | POA: Insufficient documentation

## 2017-03-06 DIAGNOSIS — Z91010 Allergy to peanuts: Secondary | ICD-10-CM | POA: Insufficient documentation

## 2017-03-06 DIAGNOSIS — F17210 Nicotine dependence, cigarettes, uncomplicated: Secondary | ICD-10-CM | POA: Insufficient documentation

## 2017-03-06 DIAGNOSIS — I10 Essential (primary) hypertension: Secondary | ICD-10-CM | POA: Insufficient documentation

## 2017-03-06 DIAGNOSIS — Z91040 Latex allergy status: Secondary | ICD-10-CM | POA: Insufficient documentation

## 2017-03-06 DIAGNOSIS — J45909 Unspecified asthma, uncomplicated: Secondary | ICD-10-CM | POA: Insufficient documentation

## 2017-03-06 MED ORDER — ACYCLOVIR 400 MG PO TABS: 400.0000 mg | ORAL_TABLET | Freq: Four times a day (QID) | ORAL | 0 refills | Status: DC

## 2017-03-06 MED ORDER — PENICILLIN G BENZATHINE 1200000 UNIT/2ML IM SUSP
2.4000 10*6.[IU] | Freq: Once | INTRAMUSCULAR | Status: AC
  Administered 2017-03-06: 2.4 10*6.[IU] via INTRAMUSCULAR
  Filled 2017-03-06: qty 4

## 2017-03-06 MED ORDER — HYDROMORPHONE HCL 1 MG/ML IJ SOLN
1.0000 mg | Freq: Once | INTRAMUSCULAR | Status: AC
  Administered 2017-03-06: 1 mg via INTRAVENOUS
  Filled 2017-03-06: qty 1

## 2017-03-06 MED ORDER — KETOROLAC TROMETHAMINE 30 MG/ML IJ SOLN
30.0000 mg | Freq: Once | INTRAMUSCULAR | Status: AC
  Administered 2017-03-06: 30 mg via INTRAMUSCULAR
  Filled 2017-03-06: qty 1

## 2017-03-06 MED ORDER — TRAMADOL HCL 50 MG PO TABS: 50.0000 mg | ORAL_TABLET | Freq: Four times a day (QID) | ORAL | 0 refills | Status: DC | PRN

## 2017-03-06 NOTE — ED Triage Notes (Signed)
Pt has history of hemorrhoids and has been bothered and treated several times at [facility]. Took friends oxy yesterday, had hard BM this am stimulating pain in rectum again.

## 2017-03-06 NOTE — ED Provider Notes (Signed)
[facility] DEPT Provider Note   CSN: [id number] Arrival date & time: 03/06/17  4580     History   Chief Complaint Chief Complaint  Patient presents with  . Hemorrhoids    HPI Mitchell Romero is a 32 y.o. male.  HPI   53yM with rectal pain. Diagnosed with proctitis on 1/10. Treated with rocephin and doxy. He reports compliance. Pain somewhat improving until had BM this morning. Has had bleeding but none in past day. Having discharge. No fever. No urinary complaints.   Past Medical History:  Diagnosis Date  . Asthma   . Hypertension    PCP took off HTN meds    There are no active problems to display for this patient.   History reviewed. No pertinent surgical history.     Home Medications    Prior to Admission medications   Medication Sig Start Date End Date Taking? Authorizing Provider  albuterol (PROVENTIL HFA;VENTOLIN HFA) 108 (90 Base) MCG/ACT inhaler Inhale 2 puffs into the lungs every 4 (four) hours as needed for wheezing or shortness of breath. 02/28/17  Yes Caryl Ada K, PA-C  docusate sodium (COLACE) 100 MG capsule Take 1 capsule (100 mg total) by mouth every 12 (twelve) hours. 02/28/17  Yes Caryl Ada K, PA-C  doxycycline (VIBRAMYCIN) 100 MG capsule Take 1 capsule (100 mg total) by mouth 2 (two) times daily. 03/03/17  Yes Leaphart, Zack Seal, PA-C  ibuprofen (ADVIL,MOTRIN) 200 MG tablet Take 200 mg by mouth every 6 (six) hours as needed for moderate pain.   Yes [provider]  Lidocaine, Anorectal, 5 % GEL Apply a pea-sized amount to the affected area 3 times daily. Do not apply inside the rectum. Patient taking differently: Place 1 application rectally 3 (three) times daily. Apply a pea-sized amount to the affected area 3 times daily. Do not apply inside the rectum. 03/03/17  Yes Leaphart, Zack Seal, PA-C  predniSONE (DELTASONE) 20 MG tablet Take 2 tablets (40 mg total) by mouth daily. 02/05/17  Yes Ward, Ozella Almond,  PA-C  valACYclovir (VALTREX) 1000 MG tablet Take 1 tablet (1,000 mg total) by mouth 2 (two) times daily. 03/03/17  Yes Leaphart, Zack Seal, PA-C  HYDROcodone-acetaminophen (NORCO/VICODIN) 5-325 MG tablet Take 2 tablets by mouth every 4 (four) hours as needed. Patient not taking: Reported on 03/06/2017 02/28/17   Fransico Meadow, PA-C  hydrocortisone (ANUSOL-HC) 2.5 % rectal cream Apply rectally 2 times daily Patient taking differently: Place 1 application rectally 2 (two) times daily. Apply rectally 2 times daily 02/28/17   Fransico Meadow, PA-C    Family History No family history on file.  Social History Social History   Tobacco Use  . Smoking status: Current Every Day Smoker    Packs/day: 0.20    Types: Cigarettes  . Smokeless tobacco: Never Used  Substance Use Topics  . Alcohol use: Yes    Comment: occasional  . Drug use: No     Allergies   Peanut-containing drug products; Food; Latex; and Powder   Review of Systems Review of Systems All systems reviewed and negative, other than as noted in HPI.   Physical Exam Updated Vital Signs BP (!) 146/83 (BP Location: Right Arm)   Pulse (!) 50   Temp 98.3 F (36.8 C) (Oral)   Resp 16   Ht 5\' 6"  (1.676 m)   Wt 99.8 kg (220 lb)   SpO2 100%   BMI 35.51 kg/m   Physical Exam  Constitutional: He appears  well-developed and well-nourished. No distress.  HENT:  Head: Normocephalic and atraumatic.  Eyes: Conjunctivae are normal. Right eye exhibits no discharge. Left eye exhibits no discharge.  Neck: Neck supple.  Cardiovascular: Normal rate, regular rhythm and normal heart sounds. Exam reveals no gallop and no friction rub.  No murmur heard. Pulmonary/Chest: Effort normal and breath sounds normal. No respiratory distress.  Abdominal: Soft. He exhibits no distension. There is no tenderness.  Genitourinary:  Genitourinary Comments: No hemorrhoids. No blood. +white discharge. Some pain with DRE but no exquisite. No mass palpated. No  fissure.   Musculoskeletal: He exhibits no edema or tenderness.  Neurological: He is alert.  Skin: Skin is warm and dry.  Psychiatric: He has a normal mood and affect. His behavior is normal. Thought content normal.  Nursing note and vitals reviewed.    ED Treatments / Results  Labs (all labs ordered are listed, but only abnormal results are displayed) Labs Reviewed - No data to display  EKG  EKG Interpretation None       Radiology No results found.  Procedures Procedures (including critical care time)  Medications Ordered in ED Medications - No data to display   Initial Impression / Assessment and Plan / ED Course  I have reviewed the triage vital signs and the nursing notes.  Pertinent labs & imaging results that were available during my care of the patient were reviewed by me and considered in my medical decision making (see chart for details).     31yM with symptoms of proctitis. Proctitis. RPR noted. May potentially have persistent titers. Bicillin. HSV collected, but doesn't appear to have been resulted yet. Will cover though with acyclovir. PRN pain meds. Reiterated partners needed testing.   Final Clinical Impressions(s) / ED Diagnoses   Final diagnoses:  Proctitis    ED Discharge Orders    None       Virgel Manifold, MD 03/06/17 [phone]

## 2017-03-17 LAB — CYTOLOGY, (ORAL, ANAL, URETHRAL) ANCILLARY ONLY: HERPES (WINDOWPATH): POSITIVE — AB

## 2017-03-20 ENCOUNTER — Other Ambulatory Visit: Payer: Self-pay

## 2017-03-20 ENCOUNTER — Encounter (HOSPITAL_COMMUNITY): Payer: Self-pay

## 2017-03-20 ENCOUNTER — Emergency Department (HOSPITAL_COMMUNITY)
Admission: EM | Admit: 2017-03-20 | Discharge: 2017-03-20 | Disposition: A | Discharge status: Home / Self Care | Payer: Self-pay | Attending: Emergency Medicine | Admitting: Emergency Medicine

## 2017-03-20 DIAGNOSIS — J45909 Unspecified asthma, uncomplicated: Secondary | ICD-10-CM | POA: Insufficient documentation

## 2017-03-20 DIAGNOSIS — E860 Dehydration: Secondary | ICD-10-CM | POA: Insufficient documentation

## 2017-03-20 DIAGNOSIS — Z91010 Allergy to peanuts: Secondary | ICD-10-CM | POA: Insufficient documentation

## 2017-03-20 DIAGNOSIS — Z91040 Latex allergy status: Secondary | ICD-10-CM | POA: Insufficient documentation

## 2017-03-20 DIAGNOSIS — Z79899 Other long term (current) drug therapy: Secondary | ICD-10-CM | POA: Insufficient documentation

## 2017-03-20 DIAGNOSIS — I10 Essential (primary) hypertension: Secondary | ICD-10-CM | POA: Insufficient documentation

## 2017-03-20 DIAGNOSIS — F17210 Nicotine dependence, cigarettes, uncomplicated: Secondary | ICD-10-CM | POA: Insufficient documentation

## 2017-03-20 LAB — COMPREHENSIVE METABOLIC PANEL
ALT: 20 U/L (ref 17–63)
ANION GAP: 6 (ref 5–15)
AST: 25 U/L (ref 15–41)
Albumin: 4.1 g/dL (ref 3.5–5.0)
Alkaline Phosphatase: 45 U/L (ref 38–126)
BILIRUBIN TOTAL: 0.3 mg/dL (ref 0.3–1.2)
BUN: 10 mg/dL (ref 6–20)
CO2: 24 mmol/L (ref 22–32)
Calcium: 9.4 mg/dL (ref 8.9–10.3)
Chloride: 108 mmol/L (ref 101–111)
Creatinine, Ser: 0.86 mg/dL (ref 0.61–1.24)
GFR calc Af Amer: >60 mL/min (ref >60)
Glucose, Bld: 122 mg/dL — ABNORMAL HIGH (ref 65–99)
POTASSIUM: 4.1 mmol/L (ref 3.5–5.1)
Sodium: 138 mmol/L (ref 135–145)
TOTAL PROTEIN: 7 g/dL (ref 6.5–8.1)

## 2017-03-20 LAB — CBC WITH DIFFERENTIAL/PLATELET
Basophils Absolute: 0 10*3/uL (ref 0.0–0.1)
Basophils Relative: 0 %
Eosinophils Absolute: 0.3 10*3/uL (ref 0.0–0.7)
Eosinophils Relative: 2 %
HEMATOCRIT: 42.2 % (ref 39.0–52.0)
Hemoglobin: 15.1 g/dL (ref 13.0–17.0)
LYMPHS PCT: 19 %
Lymphs Abs: 2.5 10*3/uL (ref 0.7–4.0)
MCH: 32.7 pg (ref 26.0–34.0)
MCHC: 35.8 g/dL (ref 30.0–36.0)
MCV: 91.3 fL (ref 78.0–100.0)
MONO ABS: 0.9 10*3/uL (ref 0.1–1.0)
MONOS PCT: 7 %
NEUTROS ABS: 9.2 10*3/uL — AB (ref 1.7–7.7)
Neutrophils Relative %: 72 %
Platelets: 306 10*3/uL (ref 150–400)
RBC: 4.62 MIL/uL (ref 4.22–5.81)
RDW: 13.3 % (ref 11.5–15.5)
WBC: 12.9 10*3/uL — ABNORMAL HIGH (ref 4.0–10.5)

## 2017-03-20 MED ORDER — SODIUM CHLORIDE 0.9 % IV BOLUS (SEPSIS)
1000.0000 mL | Freq: Once | INTRAVENOUS | Status: AC
  Administered 2017-03-20: 1000 mL via INTRAVENOUS

## 2017-03-20 MED ORDER — ACYCLOVIR 400 MG PO TABS: 400.0000 mg | ORAL_TABLET | Freq: Four times a day (QID) | ORAL | 0 refills | Status: AC

## 2017-03-20 MED ORDER — PROMETHAZINE HCL 25 MG PO TABS: 25.0000 mg | ORAL_TABLET | Freq: Four times a day (QID) | ORAL | 0 refills | Status: DC | PRN

## 2017-03-20 MED ORDER — VALACYCLOVIR HCL 500 MG PO TABS
500.0000 mg | ORAL_TABLET | Freq: Once | ORAL | Status: AC
  Administered 2017-03-20: 500 mg via ORAL
  Filled 2017-03-20: qty 1

## 2017-03-20 MED ORDER — HYDROCORTISONE 2.5 % RE CREA: TOPICAL_CREAM | RECTAL | 1 refills | Status: DC

## 2017-03-20 MED ORDER — PENICILLIN G BENZATHINE 1200000 UNIT/2ML IM SUSP
2.4000 10*6.[IU] | Freq: Once | INTRAMUSCULAR | Status: AC
  Administered 2017-03-20: 2.4 10*6.[IU] via INTRAMUSCULAR
  Filled 2017-03-20: qty 4

## 2017-03-20 MED ORDER — OXYCODONE-ACETAMINOPHEN 5-325 MG PO TABS
1.0000 | ORAL_TABLET | Freq: Once | ORAL | Status: AC
  Administered 2017-03-20: 1 via ORAL
  Filled 2017-03-20: qty 1

## 2017-03-20 MED ORDER — TRAMADOL HCL 50 MG PO TABS: 50.0000 mg | ORAL_TABLET | Freq: Four times a day (QID) | ORAL | 0 refills | Status: DC | PRN

## 2017-03-20 NOTE — ED Triage Notes (Signed)
He states he was seen recently at [facility] ED, and was dx with "hemorrhoid". He states he was notified by a [facility] Rep. A couple of days ago, who told him he tested positive for herpes. He is here today with c/o n/v/d early this morning. He is in no distress. He questions whether his identity is correct in our system, because when he registered at [facility] an address appeared at which he has never lived.

## 2017-03-20 NOTE — ED Provider Notes (Signed)
[facility] DEPT Provider Note   CSN: [id number] Arrival date & time: 03/20/17  9323     History   Chief Complaint Chief Complaint  Patient presents with  . Emesis    HPI Mitchell Romero is a 32 y.o. male.  Patient states that he has pain in his rectum and vomiting.  He was also called for positive herpes test and he is going to the health department in 2 days   The history is provided by the patient.  Emesis   This is a new problem. The current episode started 2 days ago. The problem occurs 5 to 10 times per day. The problem has not changed since onset.The emesis has an appearance of stomach contents. There has been no fever. Pertinent negatives include no abdominal pain, no chills, no cough, no diarrhea and no headaches.    Past Medical History:  Diagnosis Date  . Asthma   . Hypertension    PCP took off HTN meds    There are no active problems to display for this patient.   No past surgical history on file.     Home Medications    Prior to Admission medications   Medication Sig Start Date End Date Taking? Authorizing Provider  ibuprofen (ADVIL,MOTRIN) 200 MG tablet Take 200 mg by mouth every 6 (six) hours as needed for moderate pain.   Yes [provider]  acyclovir (ZOVIRAX) 400 MG tablet Take 1 tablet (400 mg total) by mouth 4 (four) times daily. 03/06/17   Virgel Manifold, MD  acyclovir (ZOVIRAX) 400 MG tablet Take 1 tablet (400 mg total) by mouth 4 (four) times daily. 03/20/17 04/29/17  Milton Ferguson, MD  albuterol (PROVENTIL HFA;VENTOLIN HFA) 108 (90 Base) MCG/ACT inhaler Inhale 2 puffs into the lungs every 4 (four) hours as needed for wheezing or shortness of breath. 02/28/17   Fransico Meadow, PA-C  docusate sodium (COLACE) 100 MG capsule Take 1 capsule (100 mg total) by mouth every 12 (twelve) hours. 02/28/17   Fransico Meadow, PA-C  doxycycline (VIBRAMYCIN) 100 MG capsule Take 1 capsule (100 mg total) by mouth 2 (two) times  daily. Patient taking differently: Take 100 mg by mouth 2 (two) times daily. Take 100mg  twice daily until gone. Course of therapy began on 03-03-17. 03/03/17   Doristine Devoid, PA-C  HYDROcodone-acetaminophen (NORCO/VICODIN) 5-325 MG tablet Take 2 tablets by mouth every 4 (four) hours as needed. Patient taking differently: Take 2 tablets by mouth every 4 (four) hours as needed.  02/28/17   Fransico Meadow, PA-C  hydrocortisone (ANUSOL-HC) 2.5 % rectal cream Apply rectally 2 times daily Patient taking differently: Place 1 application rectally 2 (two) times daily. Apply rectally 2 times daily 02/28/17   Caryl Ada K, PA-C  Lidocaine, Anorectal, 5 % GEL Apply a pea-sized amount to the affected area 3 times daily. Do not apply inside the rectum. Patient taking differently: Place 1 application rectally 3 (three) times daily. Apply a pea-sized amount to the affected area 3 times daily. Do not apply inside the rectum. 03/03/17   Doristine Devoid, PA-C  predniSONE (DELTASONE) 20 MG tablet Take 2 tablets (40 mg total) by mouth daily. 02/05/17   Ward, Ozella Almond, PA-C  promethazine (PHENERGAN) 25 MG tablet Take 1 tablet (25 mg total) by mouth every 6 (six) hours as needed for nausea or vomiting. 03/20/17   Milton Ferguson, MD  traMADol (ULTRAM) 50 MG tablet Take 1 tablet (50 mg total) by mouth  every 6 (six) hours as needed (pain). Patient not taking: Reported on 03/20/2017 03/06/17   Virgel Manifold, MD  traMADol (ULTRAM) 50 MG tablet Take 1 tablet (50 mg total) by mouth every 6 (six) hours as needed. 03/20/17   Milton Ferguson, MD  valACYclovir (VALTREX) 1000 MG tablet Take 1 tablet (1,000 mg total) by mouth 2 (two) times daily. Patient not taking: Reported on 03/20/2017 03/03/17   Doristine Devoid, PA-C    Family History No family history on file.  Social History Social History   Tobacco Use  . Smoking status: Current Every Day Smoker    Packs/day: 0.20    Types: Cigarettes  . Smokeless tobacco:  Never Used  Substance Use Topics  . Alcohol use: Yes    Comment: occasional  . Drug use: No     Allergies   Peanut-containing drug products; Food; Latex; and Powder   Review of Systems Review of Systems  Constitutional: Negative for appetite change, chills and fatigue.  HENT: Negative for congestion, ear discharge and sinus pressure.   Eyes: Negative for discharge.  Respiratory: Negative for cough.   Cardiovascular: Negative for chest pain.  Gastrointestinal: Positive for vomiting. Negative for abdominal pain and diarrhea.  Genitourinary: Negative for frequency and hematuria.       Rectal pain  Musculoskeletal: Negative for back pain.  Skin: Negative for rash.  Neurological: Negative for seizures and headaches.  Psychiatric/Behavioral: Negative for hallucinations.     Physical Exam Updated Vital Signs BP 136/84 (BP Location: Left Arm)   Pulse (!) 107   Temp 98.7 F (37.1 C) (Oral)   Resp 18   SpO2 98%   Physical Exam  Constitutional: He is oriented to person, place, and time. He appears well-developed.  HENT:  Head: Normocephalic.  Eyes: Conjunctivae and EOM are normal. No scleral icterus.  Neck: Neck supple. No thyromegaly present.  Cardiovascular: Normal rate and regular rhythm. Exam reveals no gallop and no friction rub.  No murmur heard. Pulmonary/Chest: No stridor. He has no wheezes. He has no rales. He exhibits no tenderness.  Abdominal: He exhibits no distension. There is no tenderness. There is no rebound.  Genitourinary:  Genitourinary Comments: Small external hemorrhoid  Musculoskeletal: Normal range of motion. He exhibits no edema.  Lymphadenopathy:    He has no cervical adenopathy.  Neurological: He is oriented to person, place, and time. He exhibits normal muscle tone. Coordination normal.  Skin: No rash noted. No erythema.  Psychiatric: He has a normal mood and affect. His behavior is normal.     ED Treatments / Results  Labs (all labs  ordered are listed, but only abnormal results are displayed) Labs Reviewed  CBC WITH DIFFERENTIAL/PLATELET - Abnormal; Notable for the following components:      Result Value   WBC 12.9 (*)    Neutro Abs 9.2 (*)    All other components within normal limits  COMPREHENSIVE METABOLIC PANEL - Abnormal; Notable for the following components:   Glucose, Bld 122 (*)    All other components within normal limits    EKG  EKG Interpretation None       Radiology No results found.  Procedures Procedures (including critical care time)  Medications Ordered in ED Medications  penicillin g benzathine (BICILLIN LA) 1200000 UNIT/2ML injection 2.4 Million Units (not administered)  valACYclovir (VALTREX) tablet 500 mg (not administered)  sodium chloride 0.9 % bolus 1,000 mL (0 mLs Intravenous Stopped 03/20/17 1019)  oxyCODONE-acetaminophen (PERCOCET/ROXICET) 5-325 MG per tablet 1 tablet (  1 tablet Oral Given 03/20/17 0856)     Initial Impression / Assessment and Plan / ED Course  I have reviewed the triage vital signs and the nursing notes.  Pertinent labs & imaging results that were available during my care of the patient were reviewed by me and considered in my medical decision making (see chart for details).    Patient has nausea vomiting and also an external hemorrhoid.  He will be given Phenergan and Anusol HC cream.  He has a positive herpes test he wants.  Now he is given acyclovir for for that.  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  Patient is to follow-up at the health department in 2 days and they will review his treatment Final Clinical Impressions(s) / ED Diagnoses   Final diagnoses:  Dehydration    ED Discharge Orders        Ordered    acyclovir (ZOVIRAX) 400 MG tablet  4 times daily     03/20/17 1036    promethazine (PHENERGAN) 25 MG tablet  Every 6 hours PRN     03/20/17 1036    traMADol (ULTRAM) 50 MG tablet  Every 6 hours PRN      03/20/17 1036       Milton Ferguson, MD 03/20/17 1041

## 2017-03-20 NOTE — Discharge Instructions (Signed)
Follow-up at the health department as planned in 2 days

## 2017-12-24 ENCOUNTER — Emergency Department (HOSPITAL_BASED_OUTPATIENT_CLINIC_OR_DEPARTMENT_OTHER)
Admission: EM | Admit: 2017-12-24 | Discharge: 2017-12-25 | Disposition: A | Discharge status: Home / Self Care | Payer: Self-pay | Attending: Emergency Medicine | Admitting: Emergency Medicine

## 2017-12-24 ENCOUNTER — Other Ambulatory Visit: Payer: Self-pay

## 2017-12-24 ENCOUNTER — Encounter (HOSPITAL_BASED_OUTPATIENT_CLINIC_OR_DEPARTMENT_OTHER): Payer: Self-pay | Admitting: *Deleted

## 2017-12-24 DIAGNOSIS — A64 Unspecified sexually transmitted disease: Secondary | ICD-10-CM | POA: Insufficient documentation

## 2017-12-24 DIAGNOSIS — Z5321 Procedure and treatment not carried out due to patient leaving prior to being seen by health care provider: Secondary | ICD-10-CM | POA: Insufficient documentation

## 2017-12-24 MED ORDER — CEFTRIAXONE SODIUM 250 MG IJ SOLR: 250.0000 mg | Freq: Once | INTRAMUSCULAR | Status: DC

## 2017-12-24 NOTE — ED Triage Notes (Signed)
Pt reports being called for +gonorrhea test. States work schedule has prevented getting treated

## 2017-12-25 NOTE — ED Notes (Signed)
Checked both lobbies and outside ED doors. Pt not found. Pt did not inform staff they were leaving

## 2017-12-25 NOTE — ED Provider Notes (Signed)
Patient LWBS after triage.   Raynald Rouillard, Delice Bison, DO 12/25/17 0022

## 2018-02-13 ENCOUNTER — Emergency Department (HOSPITAL_COMMUNITY)
Admission: EM | Admit: 2018-02-13 | Discharge: 2018-02-13 | Disposition: A | Discharge status: Home / Self Care | Payer: Self-pay | Attending: Emergency Medicine | Admitting: Emergency Medicine

## 2018-02-13 ENCOUNTER — Other Ambulatory Visit: Payer: Self-pay

## 2018-02-13 ENCOUNTER — Encounter (HOSPITAL_COMMUNITY): Payer: Self-pay

## 2018-02-13 DIAGNOSIS — J45909 Unspecified asthma, uncomplicated: Secondary | ICD-10-CM | POA: Insufficient documentation

## 2018-02-13 DIAGNOSIS — Y906 Blood alcohol level of 120-199 mg/100 ml: Secondary | ICD-10-CM | POA: Insufficient documentation

## 2018-02-13 DIAGNOSIS — R45851 Suicidal ideations: Secondary | ICD-10-CM | POA: Insufficient documentation

## 2018-02-13 DIAGNOSIS — I10 Essential (primary) hypertension: Secondary | ICD-10-CM | POA: Insufficient documentation

## 2018-02-13 DIAGNOSIS — F10929 Alcohol use, unspecified with intoxication, unspecified: Secondary | ICD-10-CM | POA: Insufficient documentation

## 2018-02-13 DIAGNOSIS — F1014 Alcohol abuse with alcohol-induced mood disorder: Secondary | ICD-10-CM | POA: Diagnosis present

## 2018-02-13 DIAGNOSIS — Z91040 Latex allergy status: Secondary | ICD-10-CM | POA: Insufficient documentation

## 2018-02-13 DIAGNOSIS — F17210 Nicotine dependence, cigarettes, uncomplicated: Secondary | ICD-10-CM | POA: Insufficient documentation

## 2018-02-13 DIAGNOSIS — R451 Restlessness and agitation: Secondary | ICD-10-CM

## 2018-02-13 DIAGNOSIS — Z91010 Allergy to peanuts: Secondary | ICD-10-CM | POA: Insufficient documentation

## 2018-02-13 LAB — CBC
HCT: 45.7 % (ref 39.0–52.0)
HEMOGLOBIN: 15.8 g/dL (ref 13.0–17.0)
MCH: 32.6 pg (ref 26.0–34.0)
MCHC: 34.6 g/dL (ref 30.0–36.0)
MCV: 94.2 fL (ref 80.0–100.0)
PLATELETS: 278 10*3/uL (ref 150–400)
RBC: 4.85 MIL/uL (ref 4.22–5.81)
RDW: 12.5 % (ref 11.5–15.5)
WBC: 10.8 10*3/uL — AB (ref 4.0–10.5)
nRBC: 0 % (ref 0.0–0.2)

## 2018-02-13 LAB — COMPREHENSIVE METABOLIC PANEL
ALBUMIN: 4.5 g/dL (ref 3.5–5.0)
ALT: 19 U/L (ref 0–44)
ANION GAP: 14 (ref 5–15)
AST: 22 U/L (ref 15–41)
Alkaline Phosphatase: 45 U/L (ref 38–126)
BILIRUBIN TOTAL: 0.6 mg/dL (ref 0.3–1.2)
BUN: 7 mg/dL (ref 6–20)
CO2: 19 mmol/L — AB (ref 22–32)
Calcium: 9 mg/dL (ref 8.9–10.3)
Chloride: 110 mmol/L (ref 98–111)
Creatinine, Ser: 0.88 mg/dL (ref 0.61–1.24)
GFR calc non Af Amer: >60 mL/min (ref >60)
GLUCOSE: 102 mg/dL — AB (ref 70–99)
POTASSIUM: 3.5 mmol/L (ref 3.5–5.1)
SODIUM: 143 mmol/L (ref 135–145)
TOTAL PROTEIN: 7.4 g/dL (ref 6.5–8.1)

## 2018-02-13 LAB — RAPID URINE DRUG SCREEN, HOSP PERFORMED
Amphetamines: NOT DETECTED
Barbiturates: NOT DETECTED
Benzodiazepines: POSITIVE — AB
COCAINE: NOT DETECTED
Opiates: NOT DETECTED
TETRAHYDROCANNABINOL: POSITIVE — AB

## 2018-02-13 LAB — ETHANOL: Alcohol, Ethyl (B): 134 mg/dL — ABNORMAL HIGH (ref <10)

## 2018-02-13 LAB — ACETAMINOPHEN LEVEL: Acetaminophen (Tylenol), Serum: <10 ug/mL — ABNORMAL LOW (ref 10–30)

## 2018-02-13 LAB — SALICYLATE LEVEL

## 2018-02-13 MED ORDER — SODIUM CHLORIDE 0.9 % IV BOLUS: 1000.0000 mL | Freq: Once | INTRAVENOUS | Status: DC

## 2018-02-13 MED ORDER — VITAMIN B-1 100 MG PO TABS
100.0000 mg | ORAL_TABLET | Freq: Every day | ORAL | Status: DC
  Administered 2018-02-13: 100 mg via ORAL
  Filled 2018-02-13: qty 1

## 2018-02-13 MED ORDER — THIAMINE HCL 100 MG/ML IJ SOLN: 100.0000 mg | Freq: Every day | INTRAMUSCULAR | Status: DC

## 2018-02-13 MED ORDER — LORAZEPAM 2 MG/ML IJ SOLN: 0.0000 mg | Freq: Two times a day (BID) | INTRAMUSCULAR | Status: DC

## 2018-02-13 MED ORDER — ONDANSETRON HCL 4 MG PO TABS: 4.0000 mg | ORAL_TABLET | Freq: Three times a day (TID) | ORAL | Status: DC | PRN

## 2018-02-13 MED ORDER — IBUPROFEN 200 MG PO TABS: 600.0000 mg | ORAL_TABLET | Freq: Three times a day (TID) | ORAL | Status: DC | PRN

## 2018-02-13 MED ORDER — ONDANSETRON HCL 4 MG/2ML IJ SOLN: 4.0000 mg | Freq: Once | INTRAMUSCULAR | Status: DC

## 2018-02-13 MED ORDER — ALUM & MAG HYDROXIDE-SIMETH 200-200-20 MG/5ML PO SUSP: 30.0000 mL | Freq: Four times a day (QID) | ORAL | Status: DC | PRN

## 2018-02-13 MED ORDER — LORAZEPAM 1 MG PO TABS
0.0000 mg | ORAL_TABLET | Freq: Two times a day (BID) | ORAL | Status: DC
Start: 2018-02-15 — End: 2018-02-13

## 2018-02-13 MED ORDER — NICOTINE 21 MG/24HR TD PT24
21.0000 mg | MEDICATED_PATCH | Freq: Every day | TRANSDERMAL | Status: DC
  Administered 2018-02-13: 21 mg via TRANSDERMAL
  Filled 2018-02-13: qty 1

## 2018-02-13 NOTE — BH Assessment (Signed)
[facility] Assessment Progress Note  Per Buford Dresser, DO, this pt does not require psychiatric hospitalization at this time.  Pt presents under IVC initiated by EDP J. Orvilla Cornwall, MD, which Dr Mariea Clonts has rescinded.  Pt is to be discharged from [facility] with recommendation to follow up with [facility].  This has been included in pt's discharge instructions.  Pt's nurse, Caryl Pina, has been notified.  Jalene Mullet, [place] Triage Specialist [phone]

## 2018-02-13 NOTE — BH Assessment (Addendum)
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 Pt reports to the ED under IVC due to SI with a plan to jump off a bridge. Pt reportedly went to the bridge and called 911 and told them to come find her body. Pt is vague throughout the assessment and does not respond to all of this writers questions. Per chart review, pt was uncooperative and aggressive with ED and EMS staff and had to be given Haldol and Versed. TTS is unable to assess if the pt has a prior psych hx. Pt reportedly consumed heavy amounts of whiskey and damaged property at a hotel room. Hx remains limited due to the pt's AMS.   Per Lindon Romp, NP pt meets criteria for inpt treatment.   Diagnosis: MDD, single episode, severe, w/o psychosis; Alcohol use disorder  Past Medical History:  Past Medical History:  Diagnosis Date  . Asthma   . Hypertension    PCP took off HTN meds    History reviewed. No pertinent surgical history.  Family History: No family history on file.  Social History:  reports that he has been smoking cigarettes. He has been smoking about 0.20 packs per day. He has never used smokeless tobacco. He reports current alcohol use. He reports that he does not use drugs.  Additional Social History:  Alcohol / Drug Use Pain Medications: See MAR Prescriptions: See MAR Over the Counter: See MAR History of alcohol / drug use?: Yes Longest period of sobriety (when/how long): unknown Substance #1 Name of Substance 1: Alcohol 1 - Age of First Use: unknown 1 - Amount (size/oz): varies 1 - Frequency: unknown 1 - Duration: ongoing 1 - Last Use / Amount: 02/12/18  CIWA: CIWA-Ar BP: 114/69 Pulse Rate: (!) 124 COWS:    Allergies:  Allergies  Allergen Reactions  . Peanut-Containing Drug Products Anaphylaxis  . Food Other (See Comments)    Tomatoes cause acid reflux Orange Juice causes vomiting  . Other Swelling    Pecans and other tree nuts  . Latex Hives  .  Powder Rash    Powder inside gloves    Home Medications: (Not in a hospital admission)   OB/GYN Status:  No LMP for male patient.  General Assessment Data Location of Assessment: [facility] ED TTS Assessment: In system Is this a Tele or Face-to-Face Assessment?: Face-to-Face Is this an Initial Assessment or a Re-assessment for this encounter?: Initial Assessment Patient Accompanied by:: N/A Language Other than English: No Living Arrangements: (unknown, pt does not respond) What gender do you identify as?: Male Marital status: Single Pregnancy Status: No Living Arrangements: Other (Comment)(UTA) Can pt return to current living arrangement?: Yes Admission Status: Involuntary Petitioner: ED Attending Is patient capable of signing voluntary admission?: No Referral Source: Self/Family/Friend Insurance type: none     Crisis Care Plan Living Arrangements: Other (Comment)(UTA) Name of Psychiatrist: [place] Name of Therapist: [facility]  Education Status Is patient currently in school?: (unknown)  Risk to self with the past 6 months Suicidal Ideation: Yes-Currently Present Has patient been a risk to self within the past 6 months prior to admission? : Yes Suicidal Intent: Yes-Currently Present Has patient had any suicidal intent within the past 6 months prior to admission? : Yes Is patient at risk for suicide?: Yes Suicidal Plan?: Yes-Currently Present Has patient had any suicidal plan within the past 6 months prior to admission? : Yes Specify Current Suicidal Plan: pt reportedly went to bridge and made threats  to jump off  Access to Means: Yes Specify Access to Suicidal Means: pt has access to a bridge  What has been your use of drugs/alcohol within the last 12 months?: current BAL 134 Previous Attempts/Gestures: (unknown) Triggers for Past Attempts: Unknown Intentional Self Injurious Behavior: (UTA) Family Suicide History: Unknown Recent stressful life event(s): Other (Comment)(stressors  unknown) Persecutory voices/beliefs?: No Depression: Yes Depression Symptoms: Loss of interest in usual pleasures, Feeling worthless/self pity, Feeling angry/irritable Substance abuse history and/or treatment for substance abuse?: Yes Suicide prevention information given to non-admitted patients: Not applicable  Risk to Others within the past 6 months Homicidal Ideation: No Does patient have any lifetime risk of violence toward others beyond the six months prior to admission? : Yes (comment)(pt aggressive in ED and EMS) Thoughts of Harm to Others: No Current Homicidal Intent: No Current Homicidal Plan: No Access to Homicidal Means: No History of harm to others?: No Assessment of Violence: On admission Violent Behavior Description: pt aggressive and combative with ED staff Does patient have access to weapons?: No Criminal Charges Pending?: No Does patient have a court date: No Is patient on probation?: No  Psychosis Hallucinations: None noted Delusions: None noted  Mental Status Report Appearance/Hygiene: In scrubs Eye Contact: Poor Motor Activity: Freedom of movement Speech: Soft Level of Consciousness: Drowsy Mood: Depressed Affect: Appropriate to circumstance Anxiety Level: None Thought Processes: Relevant, Coherent Judgement: Impaired Orientation: Person, Place, Time, Appropriate for developmental age Obsessive Compulsive Thoughts/Behaviors: None  Cognitive Functioning Concentration: Normal Memory: Remote Intact, Recent Intact Is patient IDD: No Insight: Poor Impulse Control: Poor Appetite: Fair Have you had any weight changes? : No Change Sleep: No Change Total Hours of Sleep: 6 Vegetative Symptoms: None  ADLScreening [facility] Assessment Services) Patient's cognitive ability adequate to safely complete daily activities?: Yes Patient able to express need for assistance with ADLs?: Yes Independently performs ADLs?: Yes (appropriate for developmental age)  Prior  Inpatient Therapy Prior Inpatient Therapy: (unknown)  Prior Outpatient Therapy Prior Outpatient Therapy: (unknown)  ADL Screening (condition at time of admission) Patient's cognitive ability adequate to safely complete daily activities?: Yes Is the patient deaf or have difficulty hearing?: No Does the patient have difficulty seeing, even when wearing glasses/contacts?: No Does the patient have difficulty concentrating, remembering, or making decisions?: No Patient able to express need for assistance with ADLs?: Yes Does the patient have difficulty dressing or bathing?: No Independently performs ADLs?: Yes (appropriate for developmental age) Does the patient have difficulty walking or climbing stairs?: No Weakness of Legs: None Weakness of Arms/Hands: None  Home Assistive Devices/Equipment Home Assistive Devices/Equipment: None    Abuse/Neglect Assessment (Assessment to be complete while patient is alone) Abuse/Neglect Assessment Can Be Completed: Unable to assess, patient is non-responsive or altered mental status     Advance Directives (For Healthcare) Does Patient Have a Medical Advance Directive?: No Would patient like information on creating a medical advance directive?: No - Patient declined          Disposition:  Per Lindon Romp, NP pt meets criteria for inpt treatment.   Disposition Initial Assessment Completed for this Encounter: Yes Disposition of Patient: Admit Type of inpatient treatment program: Adult Patient refused recommended treatment: No  On Site Evaluation by:   Reviewed with Physician:    Lyanne Co 02/13/2018 5:41 AM

## 2018-02-13 NOTE — BHH Suicide Risk Assessment (Signed)
Suicide Risk Assessment  Discharge Assessment   [facility] Discharge Suicide Risk Assessment   Principal Problem: Alcohol abuse with alcohol-induced mood disorder [facility]) Discharge Diagnoses: Principal Problem:   Alcohol abuse with alcohol-induced mood disorder ([place])   Total Time spent with patient: 45 minutes  Musculoskeletal: Strength & Muscle Tone: within normal limits Gait & Station: normal Patient leans: N/A  Psychiatric Specialty Exam:   Blood pressure 114/69, pulse (!) 124, temperature 97.7 F (36.5 C), temperature source Oral, resp. rate 16, height 5\' 11"  (1.803 m), weight 95.3 kg, SpO2 98 %.Body mass index is 29.29 kg/m.  General Appearance: Casual  Eye Contact::  Good  Speech:  Normal Rate409  Volume:  Normal  Mood:  Euthymic  Affect:  Congruent  Thought Process:  Coherent and Descriptions of Associations: Intact  Orientation:  Full (Time, Place, and Person)  Thought Content:  WDL and Logical  Suicidal Thoughts:  No  Homicidal Thoughts:  No  Memory:  Immediate;   Good Recent;   Good Remote;   Good  Judgement:  Good  Insight:  Good  Psychomotor Activity:  Normal  Concentration:  Good  Recall:  Good  Fund of Knowledge:Good  Language: Good  Akathisia:  No  Handed:  Right  AIMS (if indicated):     Assets:  Housing Leisure Time Physical Health Resilience Social Support  Sleep:     Cognition: WNL  ADL's:  Intact   Mental Status Per Nursing Assessment::   On Admission:   32 yo male who presented to the ED after getting "drunk" and having suicidal ideations.  Today on assessment, he denies suicidal/homicidal ideations, hallucinations, and withdrawal symptoms.  No past suicide attempts.  Gave collateral information willingly, pleasant and cooperative.  States, "I feel way better."  Stable for discharge.  Demographic Factors:  Male and Living alone  Loss Factors: NA  Historical Factors: NA  Risk Reduction Factors:   Sense of responsibility to family and  Positive social support  Continued Clinical Symptoms:  None  Cognitive Features That Contribute To Risk:  None    Suicide Risk:  Minimal: No identifiable suicidal ideation.  Patients presenting with no risk factors but with morbid ruminations; may be classified as minimal risk based on the severity of the depressive symptoms    Plan Of Care/Follow-up recommendations:  Activity:  as tolerated  Diet:  heart healthy diet  Diasia Henken, NP 02/13/2018, 10:55 AM

## 2018-02-13 NOTE — ED Provider Notes (Signed)
[place] DEPT Provider Note: Georgena Spurling, MD, FACEP  CSN: [id number] MRN: [id number] ARRIVAL: 02/13/18 at [place]: [place]  Suicidal (IVC)  Level 5 caveat: Uncooperative HISTORY OF PRESENT ILLNESS  02/13/18 3:38 AM Mitchell Romero is a 32 y.o. male to male transgender patient.  This patient was brought in by EMS after being found attempting to jump off of a bridge in a suicide attempt.  Patient had been drinking and "destroyed" their hotel room.  Reportedly the patient called 911 telling them "to find my body".  The patient was agitated prior to arrival and had to be sedated with Versed 5 mg IM and Haldol 5 mg IM.  The patient remains belligerent and uncooperative.   Past Medical History:  Diagnosis Date  . Asthma   . Hypertension    PCP took off HTN meds    History reviewed. No pertinent surgical history.  No family history on file.  Social History   Tobacco Use  . Smoking status: Current Every Day Smoker    Packs/day: 0.20    Types: Cigarettes  . Smokeless tobacco: Never Used  Substance Use Topics  . Alcohol use: Yes    Comment: occasional  . Drug use: No    Prior to Admission medications   Medication Sig Start Date End Date Taking? Authorizing Provider  albuterol (PROVENTIL HFA;VENTOLIN HFA) 108 (90 Base) MCG/ACT inhaler Inhale 2 puffs into the lungs every 4 (four) hours as needed for wheezing or shortness of breath. 02/28/17  Yes Fransico Meadow, PA-C    Allergies Peanut-containing drug products; Food; Other; Latex; and Powder   REVIEW OF SYSTEMS     PHYSICAL EXAMINATION  Initial Vital Signs Blood pressure 114/69, pulse (!) 124, temperature 97.7 F (36.5 C), temperature source Oral, resp. rate 16, height 5\' 11"  (1.803 m), weight 95.3 kg, SpO2 98 %.  Examination General: Well-developed, well-nourished patient in no acute distress; appearance consistent with age of record HENT: normocephalic; atraumatic Eyes: Normal  appearance Neck: supple Heart: regular rate and rhythm Lungs: No respiratory effort and excursion Abdomen: soft; nondistended Extremities: No deformity; full range of motion Neurologic: Awake, alert; motor function intact in all extremities and symmetric; no facial droop Skin: Warm and dry Psychiatric: Argumentative, shouting obscenities, uncooperative; suicidal ideation   RESULTS  Summary of this visit's results, reviewed by myself:   EKG Interpretation  Date/Time:    Ventricular Rate:    PR Interval:    QRS Duration:   QT Interval:    QTC Calculation:   R Axis:     Text Interpretation:        Laboratory Studies: Results for orders placed or performed during the hospital encounter of 02/13/18 (from the past 24 hour(s))  Comprehensive metabolic panel     Status: Abnormal   Collection Time: 02/13/18  3:20 AM  Result Value Ref Range   Sodium 143 135 - 145 mmol/L   Potassium 3.5 3.5 - 5.1 mmol/L   Chloride 110 98 - 111 mmol/L   CO2 19 (L) 22 - 32 mmol/L   Glucose, Bld 102 (H) 70 - 99 mg/dL   BUN 7 6 - 20 mg/dL   Creatinine, Ser 0.88 0.61 - 1.24 mg/dL   Calcium 9.0 8.9 - 10.3 mg/dL   Total Protein 7.4 6.5 - 8.1 g/dL   Albumin 4.5 3.5 - 5.0 g/dL   AST 22 15 - 41 U/L   ALT 19 0 - 44 U/L   Alkaline Phosphatase  45 38 - 126 U/L   Total Bilirubin 0.6 0.3 - 1.2 mg/dL   GFR calc non Af Amer >60 >60 mL/min   GFR calc Af Amer >60 >60 mL/min   Anion gap 14 5 - 15  Ethanol     Status: Abnormal   Collection Time: 02/13/18  3:20 AM  Result Value Ref Range   Alcohol, Ethyl (B) 134 (H) <92 mg/dL  Salicylate level     Status: None   Collection Time: 02/13/18  3:20 AM  Result Value Ref Range   Salicylate Lvl <4.2 2.8 - 30.0 mg/dL  Acetaminophen level     Status: Abnormal   Collection Time: 02/13/18  3:20 AM  Result Value Ref Range   Acetaminophen (Tylenol), Serum <10 (L) 10 - 30 ug/mL  cbc     Status: Abnormal   Collection Time: 02/13/18  3:20 AM  Result Value Ref Range    WBC 10.8 (H) 4.0 - 10.5 K/uL   RBC 4.85 4.22 - 5.81 MIL/uL   Hemoglobin 15.8 13.0 - 17.0 g/dL   HCT 45.7 39.0 - 52.0 %   MCV 94.2 80.0 - 100.0 fL   MCH 32.6 26.0 - 34.0 pg   MCHC 34.6 30.0 - 36.0 g/dL   RDW 12.5 11.5 - 15.5 %   Platelets 278 150 - 400 K/uL   nRBC 0.0 0.0 - 0.2 %   Imaging Studies: No results found.  ED COURSE and MDM  Nursing notes and initial vitals signs, including pulse oximetry, reviewed.  Vitals:   02/13/18 0311 02/13/18 0316 02/13/18 0327  BP:  114/69   Pulse:  (!) 124   Resp:  16   Temp:  97.7 F (36.5 C)   TempSrc:  Oral   SpO2: 97% 98%   Weight:   95.3 kg  Height:   5\' 11"  (1.803 m)    PROCEDURES    ED DIAGNOSES     ICD-10-CM   1. Suicidal ideation R45.851   2. Agitation requiring sedation protocol R45.1   3. Alcoholic intoxication with complication ([place]) A83.419        Shanon Rosser, MD 02/13/18 [phone]

## 2018-02-13 NOTE — Discharge Instructions (Signed)
For your mental health needs, you are advised to follow up with [facility].  New and returning patients are seen at their walk-in clinic.  Walk-in hours are Monday - Friday from 8:00 am - 3:00 pm.  Walk-in patients are seen on a first come, first served basis.  Try to arrive as early as possible for he best chance of being seen the same day: ° °     [facility] °     [address] °     [place],  [address] °     [phone] °

## 2018-02-13 NOTE — Progress Notes (Signed)
Received Mitchell Romero with police escort to room 35, he immediately went to sleep without incident. No acute distress noted. It was reported he was standing on a bridge and threatening to jump with a bottle of whiskey. He was given Versed and Haldol in the main ED before coming to thew [place].

## 2018-02-13 NOTE — ED Notes (Signed)
Pt d/c home per MD order. Discharge summary reviewed with pt. Pt verbalizes understanding. Denies SI/HI/AVH. Pt signed e-signature. Personal property returned. Ambulatory off unit with MHT.

## 2018-02-13 NOTE — ED Notes (Signed)
Bed: EH21 Expected date:  Expected time:  Means of arrival:  Comments: 59 F Combative SI

## 2018-02-13 NOTE — BH Assessment (Signed)
[facility] Assessment Progress Note   TTS attempted to call patient's Coral Spikes at [phone].  No answer.  TTS left HIPPA compliant voicemail.

## 2018-02-13 NOTE — BH Assessment (Signed)
[place] Assessment Progress Note  Per Lindon Romp, NP pt meets criteria for inpt treatment. TTS to seek placement. TTS attempted to inform EDP Molpus, John, MD at ext [phone] but line rolls over to Network engineer. TTS informed the pt's nurse Roxy Manns, RN of the disposition.   Lind Covert, MSW, LCSW Therapeutic Triage Specialist  [phone]

## 2018-02-13 NOTE — ED Triage Notes (Addendum)
Per ems: Pt coming from a bridge after attempting to jump due to SI. Pt had a bottle of whiskey and destroyed her hotel room. Pt called 911 telling them "to find her body."  Hx of HTN and crack use.  Pt identifies as woman Mitchell Romero   5mg  Versed and 5mg  Haldol IM given by EMS   *EMERGENCY IVC*

## 2018-06-13 IMAGING — CT CT PELVIS W/ CM
2 of 3 series · 16 of 46 positions shown, 18 images · IV contrast (iopamidol)
Comparison: CT abdomen and pelvis June 04, 2012

CLINICAL DATA: Perirectal pain and bleeding.  Reported hemorrhoids

EXAM:
CT PELVIS WITH CONTRAST
TECHNIQUE: Multidetector CT imaging of the pelvis was performed using the
standard protocol following the bolus administration of intravenous
contrast.
CONTRAST:  100mL 9QB9C4-HDD IOPAMIDOL ([id number]-HDD) INJECTION 61%

[Series 3: pelvis with 5.0 · axial · 0.82mm/px · z∈[+760,+1030]mm · 13 of 62 slices shown, 15 images]
[im 4/62  soft-tissue]
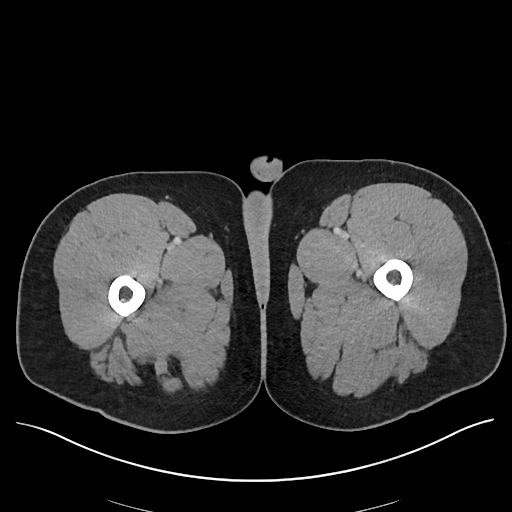
[im 4/62  bone]
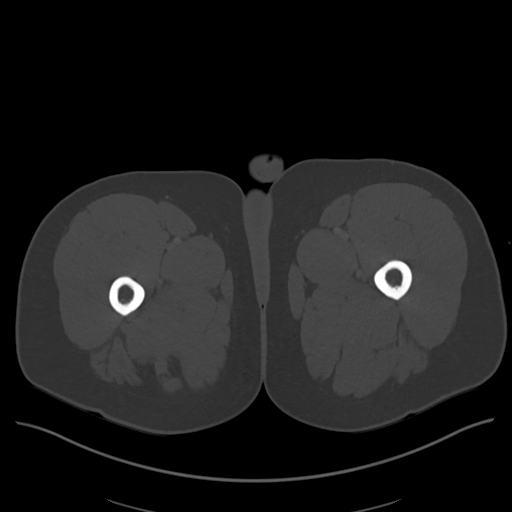
[im 8/62  soft-tissue]
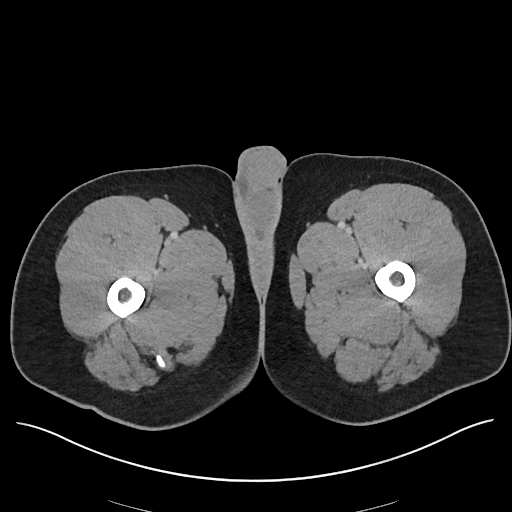
[im 12/62  soft-tissue]
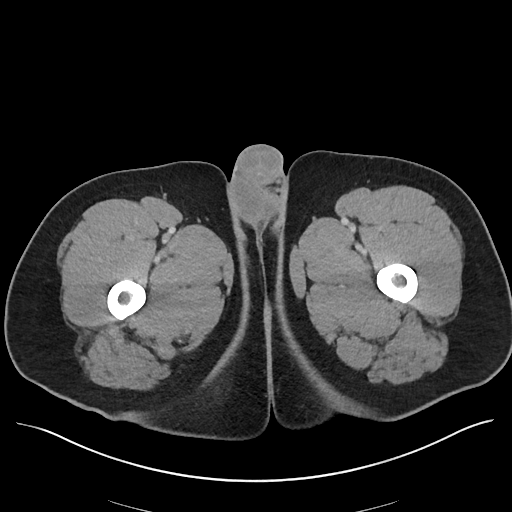
[im 18/62  soft-tissue]
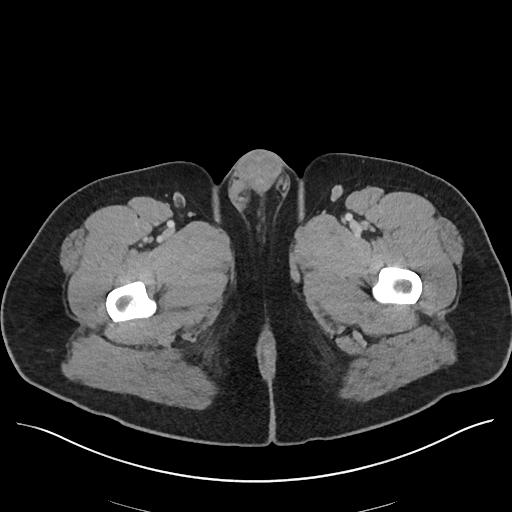
[im 22/62  soft-tissue]
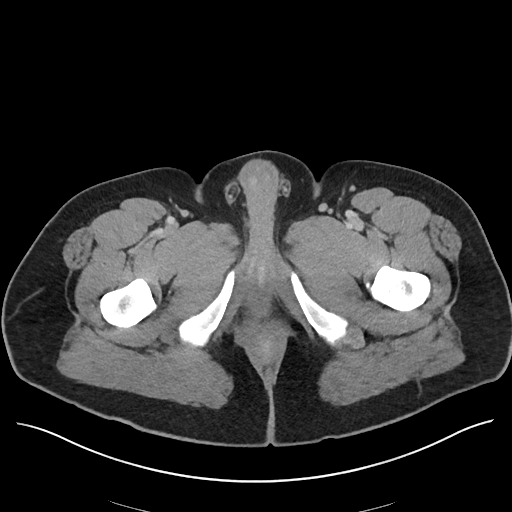
[im 26/62  soft-tissue]
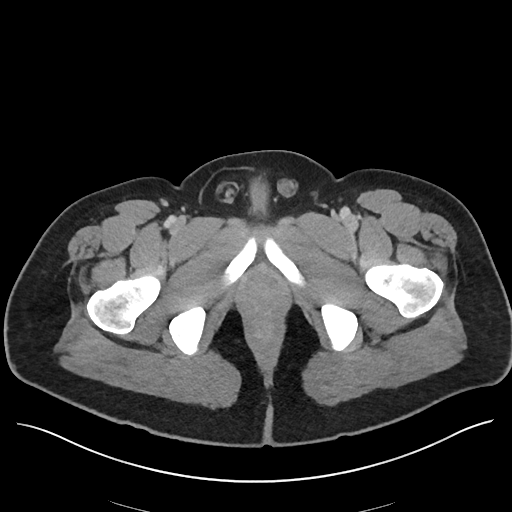
[im 32/62  soft-tissue]
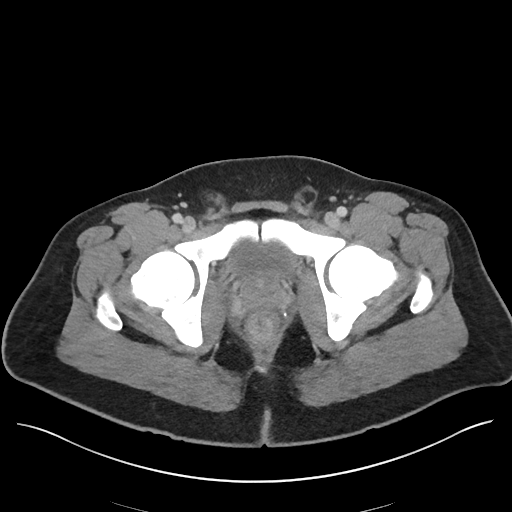
[im 36/62  soft-tissue]
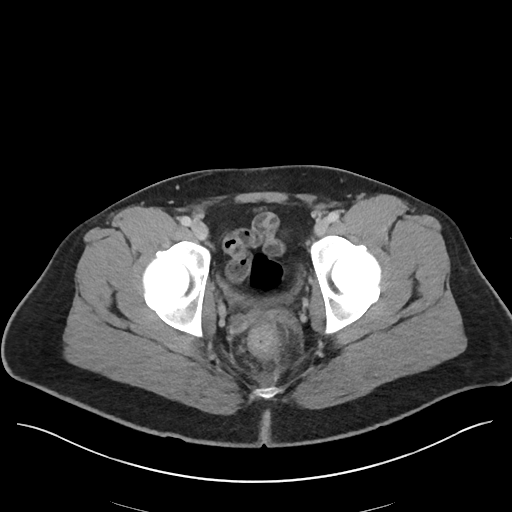
[im 40/62  soft-tissue]
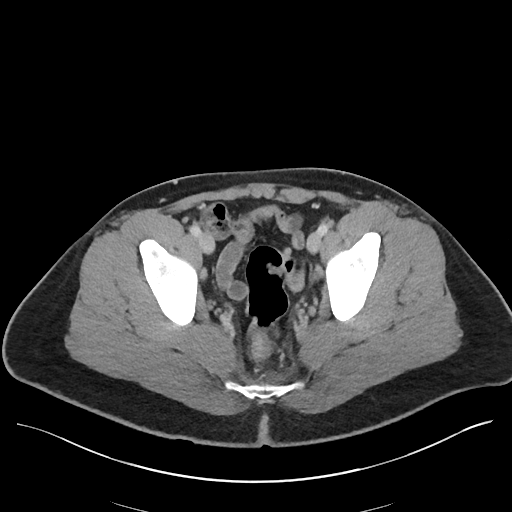
[im 40/62  bone]
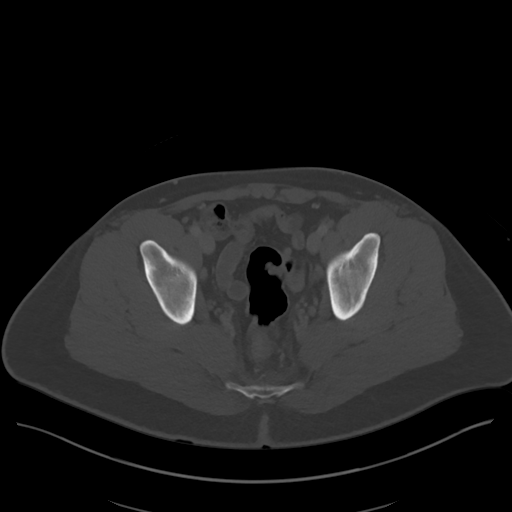
[im 44/62  soft-tissue]
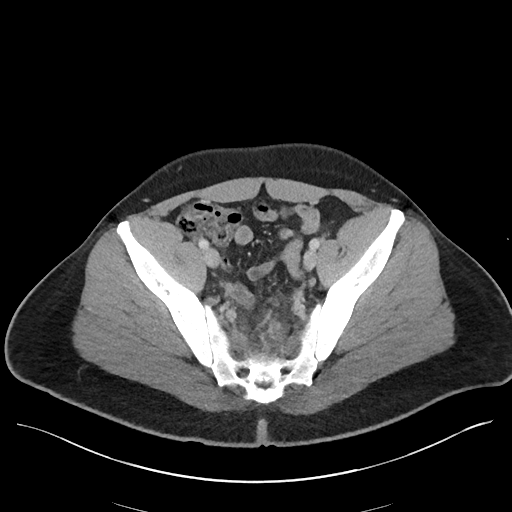
[im 50/62  soft-tissue]
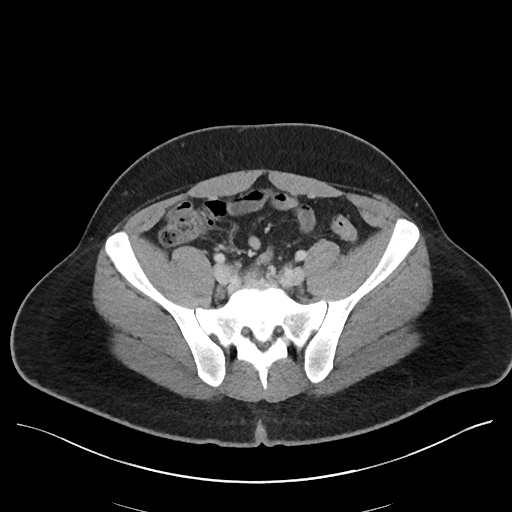
[im 54/62  soft-tissue]
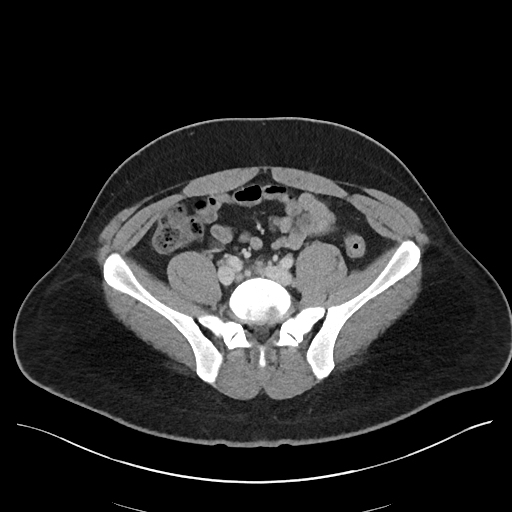
[im 58/62  soft-tissue]
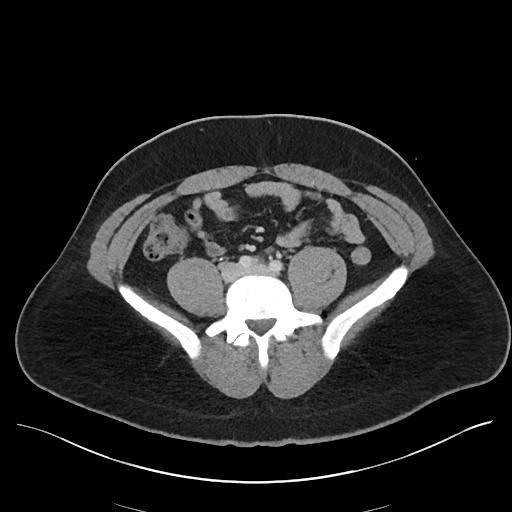

[Series 5: pelvis with 2.0 cor · coronal · 0.63mm/px · 3 of 151 slices shown]
[im 51/151  soft-tissue]
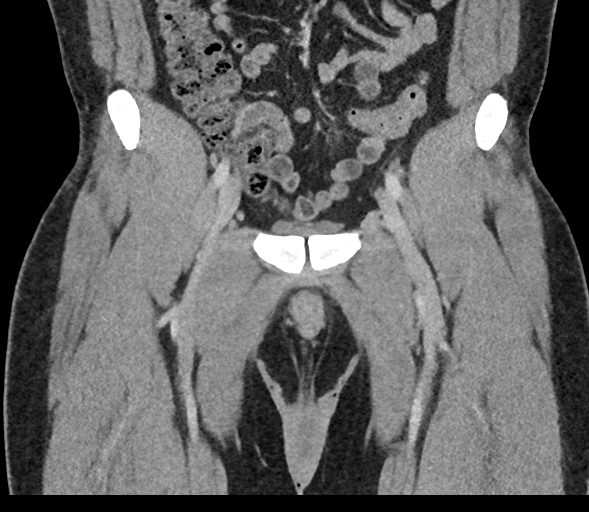
[im 67/151  soft-tissue]
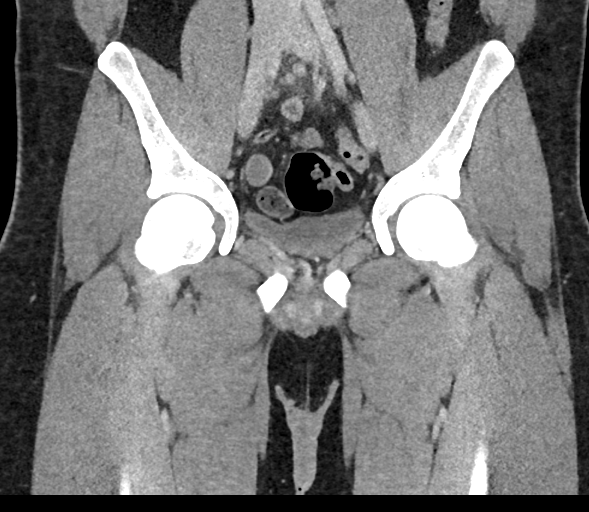
[im 84/151  soft-tissue]
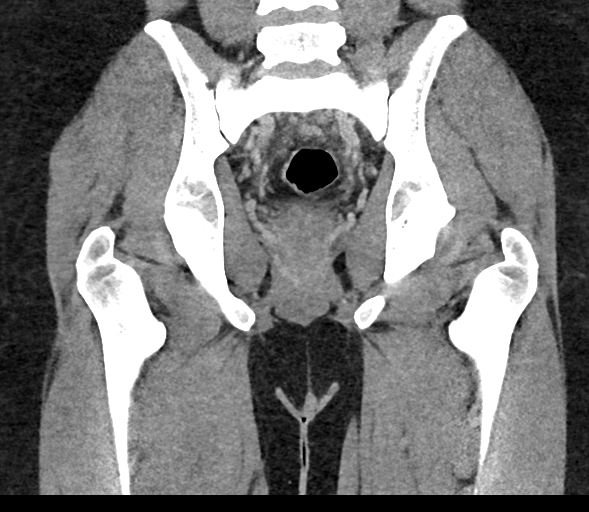

[16 of 46 positions shown; findings below may reference images not displayed]

FINDINGS: Urinary Tract: Urinary bladder is midline with wall thickness within
normal limits. Visualized ureters appear unremarkable.

Bowel: There is soft tissue stranding in the perirectal region with
mildly prominent perirectal vascular structures. There is no
well-defined mass or abnormal fluid collection in the
perirectal/perianal region. No abscess evident. Soft tissue
stranding is also noted along the upper region without abnormal
fluid or abscess. Elsewhere, visualized bowel appears normal. No
evident bowel obstruction. No free air is appreciable.

Vascular/Lymphatic: Visualized vascular structures appear normal. No
adenopathy is appreciable in the pelvis. Subcentimeter inguinal
lymph nodes are considered nonspecific.

Reproductive: Prostate and seminal vesicles appear normal in size
and contour. No pelvic mass evident.

Other: Appendix appears normal. No ascites or abscess evident in the
pelvis.

Musculoskeletal: No blastic or lytic bone lesions. No intramuscular
or abdominal wall lesion evident.
IMPRESSION: 1. There is perirectal soft tissue stranding consistent with a
degree of proctitis. Note that the rectal wall does not appear
appreciably thickened. Mild prominence of vascular structures in the
perirectal region likely represent hemorrhoidal tissue.

2. No perirectal or perianal abscess. No evidence of abscess
elsewhere in the pelvis.

3. No bowel obstruction. No abnormal fluid collection. No ascites.

4.  Study otherwise unremarkable.

## 2018-06-16 ENCOUNTER — Ambulatory Visit (HOSPITAL_COMMUNITY)
Admission: EM | Admit: 2018-06-16 | Discharge: 2018-06-16 | Disposition: A | Discharge status: Home / Self Care | Payer: Self-pay | Attending: Family Medicine | Admitting: Family Medicine

## 2018-06-16 ENCOUNTER — Encounter (HOSPITAL_COMMUNITY): Payer: Self-pay | Admitting: Emergency Medicine

## 2018-06-16 ENCOUNTER — Other Ambulatory Visit: Payer: Self-pay

## 2018-06-16 DIAGNOSIS — K219 Gastro-esophageal reflux disease without esophagitis: Secondary | ICD-10-CM

## 2018-06-16 MED ORDER — LIDOCAINE VISCOUS HCL 2 % MT SOLN
OROMUCOSAL | Status: AC
  Filled 2018-06-16: qty 15

## 2018-06-16 MED ORDER — LIDOCAINE VISCOUS HCL 2 % MT SOLN
15.0000 mL | Freq: Once | OROMUCOSAL | Status: AC
  Administered 2018-06-16: 15 mL via ORAL

## 2018-06-16 MED ORDER — ALUM & MAG HYDROXIDE-SIMETH 200-200-20 MG/5ML PO SUSP
30.0000 mL | Freq: Once | ORAL | Status: AC
  Administered 2018-06-16: 30 mL via ORAL

## 2018-06-16 MED ORDER — ALUM & MAG HYDROXIDE-SIMETH 200-200-20 MG/5ML PO SUSP
ORAL | Status: AC
  Filled 2018-06-16: qty 30

## 2018-06-16 NOTE — ED Notes (Signed)
Patient able to ambulate independently  

## 2018-06-16 NOTE — ED Triage Notes (Signed)
Pt presents to [facility] for assessment of nausea starting last night with one episode of emesis.  Patient states they are very hungry today but afraid to eat anything until we clear them.

## 2018-06-16 NOTE — Discharge Instructions (Addendum)
Most likely her symptoms are related to acid reflux GI cocktail given here in clinic You can start taking Prilosec daily for the next week or so Make sure you are avoiding spicy, greasy foods and milk products Caffeine and chocolate can also exacerbate acid reflux Follow up as needed for continued or worsening symptoms

## 2018-06-18 NOTE — ED Provider Notes (Signed)
[facility]    CSN: [id number] Arrival date & time: 06/16/18  1042     History   Chief Complaint Chief Complaint  Patient presents with  . Nausea    HPI Mitchell Romero is a 33 y.o. male.   Patient is a 33 year old male who presents today for nausea after eating that started last night.  He had one episode of nonbilious emesis.  He has since had improvement in symptoms and was hoping to eat but nervous to get sick again.  Reporting some burning in the epigastric area.  He does have a history of acid reflux.  He is not currently having any nausea.  He has been sipping fluids.  No diarrhea or fevers.  no recent sick contacts or recent traveling.  ROS per HPI      Past Medical History:  Diagnosis Date  . Asthma   . Hypertension    PCP took off HTN meds    Patient Active Problem List   Diagnosis Date Noted  . Alcohol abuse with alcohol-induced mood disorder ([place]) 02/13/2018    History reviewed. No pertinent surgical history.     Home Medications    Prior to Admission medications   Not on File    Family History History reviewed. No pertinent family history.  Social History Social History   Tobacco Use  . Smoking status: Current Every Day Smoker    Packs/day: 0.20    Types: Cigarettes  . Smokeless tobacco: Never Used  Substance Use Topics  . Alcohol use: Yes    Comment: occasional  . Drug use: No     Allergies   Peanut-containing drug products; Food; Other; Latex; and Powder   Review of Systems Review of Systems   Physical Exam Triage Vital Signs ED Triage Vitals  Enc Vitals Group     BP 06/16/18 1100 135/79     Pulse Rate 06/16/18 1100 98     Resp 06/16/18 1100 18     Temp 06/16/18 1100 98.4 F (36.9 C)     Temp Source 06/16/18 1100 Oral     SpO2 06/16/18 1100 100 %     Weight --      Height --      Head Circumference --      Peak Flow --      Pain Score 06/16/18 1101 4     Pain Loc --      Pain Edu? --      Excl. in  [place]? --    No data found.  Updated Vital Signs BP 135/79 (BP Location: Left Arm)   Pulse 98   Temp 98.4 F (36.9 C) (Oral)   Resp 18   SpO2 100%   Visual Acuity Right Eye Distance:   Left Eye Distance:   Bilateral Distance:    Right Eye Near:   Left Eye Near:    Bilateral Near:     Physical Exam Vitals signs and nursing note reviewed.  Constitutional:      Appearance: Normal appearance.  HENT:     Head: Normocephalic and atraumatic.     Nose: Nose normal.  Eyes:     Conjunctiva/sclera: Conjunctivae normal.  Neck:     Musculoskeletal: Normal range of motion.  Pulmonary:     Effort: Pulmonary effort is normal.  Abdominal:     Palpations: Abdomen is soft.     Tenderness: There is no abdominal tenderness.  Musculoskeletal: Normal range of motion.  Skin:  General: Skin is warm and dry.  Neurological:     Mental Status: He is alert.  Psychiatric:        Mood and Affect: Mood normal.      UC Treatments / Results  Labs (all labs ordered are listed, but only abnormal results are displayed) Labs Reviewed - No data to display  EKG None  Radiology No results found.  Procedures Procedures (including critical care time)  Medications Ordered in UC Medications  alum & mag hydroxide-simeth (MAALOX/MYLANTA) 200-200-20 MG/5ML suspension 30 mL (30 mLs Oral Given 06/16/18 1126)    And  lidocaine (XYLOCAINE) 2 % viscous mouth solution 15 mL (15 mLs Oral Given 06/16/18 1126)    Initial Impression / Assessment and Plan / UC Course  I have reviewed the triage vital signs and the nursing notes.  Pertinent labs & imaging results that were available during my care of the patient were reviewed by me and considered in my medical decision making (see chart for details).     Most likely symptoms are related to GERD GI cocktail given here in clinic with improvement in symptoms.  Instructed that he can start taking Prilosec daily for symptoms. Instructions given on what to  avoid to trigger symptoms. Follow up as needed for continued or worsening symptoms  Final Clinical Impressions(s) / UC Diagnoses   Final diagnoses:  Gastroesophageal reflux disease, esophagitis presence not specified     Discharge Instructions     Most likely her symptoms are related to acid reflux GI cocktail given here in clinic You can start taking Prilosec daily for the next week or so Make sure you are avoiding spicy, greasy foods and milk products Caffeine and chocolate can also exacerbate acid reflux Follow up as needed for continued or worsening symptoms     ED Prescriptions    None     Controlled Substance Prescriptions [place] Controlled Substance Registry consulted? no   Orvan July, NP 06/18/18 1653

## 2019-04-27 ENCOUNTER — Emergency Department (HOSPITAL_COMMUNITY)
Admission: EM | Admit: 2019-04-27 | Discharge: 2019-04-27 | Disposition: A | Discharge status: Home / Self Care | Payer: HRSA Program | Attending: Emergency Medicine | Admitting: Emergency Medicine

## 2019-04-27 ENCOUNTER — Other Ambulatory Visit: Payer: Self-pay

## 2019-04-27 ENCOUNTER — Encounter (HOSPITAL_COMMUNITY): Payer: Self-pay | Admitting: Emergency Medicine

## 2019-04-27 DIAGNOSIS — B20 Human immunodeficiency virus [HIV] disease: Secondary | ICD-10-CM | POA: Diagnosis not present

## 2019-04-27 DIAGNOSIS — U071 COVID-19: Secondary | ICD-10-CM | POA: Diagnosis not present

## 2019-04-27 DIAGNOSIS — F17210 Nicotine dependence, cigarettes, uncomplicated: Secondary | ICD-10-CM | POA: Insufficient documentation

## 2019-04-27 DIAGNOSIS — Z91040 Latex allergy status: Secondary | ICD-10-CM | POA: Diagnosis not present

## 2019-04-27 DIAGNOSIS — R509 Fever, unspecified: Secondary | ICD-10-CM | POA: Diagnosis not present

## 2019-04-27 DIAGNOSIS — M7918 Myalgia, other site: Secondary | ICD-10-CM | POA: Diagnosis present

## 2019-04-27 HISTORY — DX: Human immunodeficiency virus (HIV) disease: B20

## 2019-04-27 HISTORY — DX: Asymptomatic human immunodeficiency virus (hiv) infection status: Z21

## 2019-04-27 LAB — BASIC METABOLIC PANEL
Anion gap: 9 (ref 5–15)
BUN: 8 mg/dL (ref 6–20)
CO2: 22 mmol/L (ref 22–32)
Calcium: 9.4 mg/dL (ref 8.9–10.3)
Chloride: 109 mmol/L (ref 98–111)
Creatinine, Ser: 1.07 mg/dL (ref 0.61–1.24)
GFR calc Af Amer: >60 mL/min (ref >60)
GFR calc non Af Amer: >60 mL/min (ref >60)
Glucose, Bld: 125 mg/dL — ABNORMAL HIGH (ref 70–99)
Potassium: 3.8 mmol/L (ref 3.5–5.1)
Sodium: 140 mmol/L (ref 135–145)

## 2019-04-27 LAB — CBC
HCT: 42.1 % (ref 39.0–52.0)
Hemoglobin: 14.9 g/dL (ref 13.0–17.0)
MCH: 33.6 pg (ref 26.0–34.0)
MCHC: 35.4 g/dL (ref 30.0–36.0)
MCV: 94.8 fL (ref 80.0–100.0)
Platelets: 231 10*3/uL (ref 150–400)
RBC: 4.44 MIL/uL (ref 4.22–5.81)
RDW: 12.6 % (ref 11.5–15.5)
WBC: 9.8 10*3/uL (ref 4.0–10.5)
nRBC: 0 % (ref 0.0–0.2)

## 2019-04-27 LAB — CBG MONITORING, ED: Glucose-Capillary: 132 mg/dL — ABNORMAL HIGH (ref 70–99)

## 2019-04-27 MED ORDER — IBUPROFEN 400 MG PO TABS
400.0000 mg | ORAL_TABLET | Freq: Once | ORAL | Status: AC
  Administered 2019-04-27: 20:00:00 400 mg via ORAL
  Filled 2019-04-27: qty 1

## 2019-04-27 NOTE — ED Triage Notes (Signed)
Pt reports testing covid + 4 days ago. Endorses body aches. Hx of DM and HIV. CBG 132 in triage

## 2019-04-27 NOTE — ED Notes (Signed)
Patient verbalizes understanding of discharge instructions. Opportunity for questioning and answers were provided. Armband removed by staff, pt discharged from ED ambulatory to home.  

## 2019-04-27 NOTE — ED Provider Notes (Addendum)
[place] [facility] EMERGENCY DEPARTMENT Provider Note   CSN: [id number] Arrival date & time: 04/27/19  1841     History Chief Complaint  Patient presents with  . Generalized Body Aches    Mitchell Romero is a 34 y.o. male.  Patient w hx hiv (unsure of any recent labs, counts, etc, states pcp in [place]) c/o body aches for past couple days. States tested positive for covid 2 days ago, and since then notes increased body aches, diffuse. Symptoms acute onset, moderate, persistent, constant, felt worse today.  No focal joint pain or swelling. Occasional non prod cough. No sob. No chest pain or discomfort. Low grade fever. Denies chills/sweats. No severe headaches. No vomiting or diarrhea. No abd pain. No dysuria or gu c/o. States had not taken any meds today for either body aches or fevers.   The history is provided by the patient.       Past Medical History:  Diagnosis Date  . Asthma   . HIV (human immunodeficiency virus infection) ([place])   . Hypertension    PCP took off HTN meds    Patient Active Problem List   Diagnosis Date Noted  . Alcohol abuse with alcohol-induced mood disorder ([place]) 02/13/2018    History reviewed. No pertinent surgical history.     No family history on file.  Social History   Tobacco Use  . Smoking status: Current Every Day Smoker    Packs/day: 0.20    Types: Cigarettes  . Smokeless tobacco: Never Used  Substance Use Topics  . Alcohol use: Yes    Comment: occasional  . Drug use: No    Home Medications Prior to Admission medications   Not on File    Allergies    Peanut-containing drug products, Food, Other, Latex, and Powder  Review of Systems   Review of Systems  Constitutional: Positive for fever.  HENT: Negative for sore throat.   Eyes: Negative for pain and redness.  Respiratory: Negative for shortness of breath.   Cardiovascular: Negative for chest pain.  Gastrointestinal: Negative for abdominal pain and vomiting.   Endocrine: Negative for polyuria.  Genitourinary: Negative for dysuria and flank pain.  Musculoskeletal: Positive for myalgias. Negative for back pain and neck pain.  Skin: Negative for rash.  Neurological: Negative for headaches.  Hematological: Does not bruise/bleed easily.  Psychiatric/Behavioral: Negative for confusion.    Physical Exam Updated Vital Signs BP (!) 111/97   Pulse (!) 112   Temp 99.2 F (37.3 C) (Oral)   Ht 1.702 m (5\' 7" )   SpO2 94%   BMI 32.89 kg/m   Physical Exam Vitals and nursing note reviewed.  Constitutional:      Appearance: Normal appearance. He is well-developed.  HENT:     Head: Atraumatic.     Nose: Nose normal.     Mouth/Throat:     Mouth: Mucous membranes are moist.     Pharynx: Oropharynx is clear.     Comments: Mmm. No thrush. Eyes:     General: No scleral icterus.    Conjunctiva/sclera: Conjunctivae normal.  Neck:     Trachea: No tracheal deviation.     Comments: No stiffness or rigidity.  Cardiovascular:     Rate and Rhythm: Normal rate and regular rhythm.     Pulses: Normal pulses.     Heart sounds: Normal heart sounds. No murmur. No friction rub. No gallop.   Pulmonary:     Effort: Pulmonary effort is normal. No accessory muscle usage  or respiratory distress.     Breath sounds: Normal breath sounds.  Abdominal:     General: Bowel sounds are normal. There is no distension.     Palpations: Abdomen is soft.     Tenderness: There is no abdominal tenderness. There is no guarding.  Genitourinary:    Comments: No cva tenderness. Musculoskeletal:        General: No swelling or tenderness.     Cervical back: Normal range of motion and neck supple. No rigidity.     Right lower leg: No edema.     Left lower leg: No edema.  Skin:    General: Skin is warm and dry.     Findings: No rash.  Neurological:     Mental Status: He is alert.     Comments: Alert, speech clear. Steady gait.   Psychiatric:        Mood and Affect: Mood  normal.     ED Results / Procedures / Treatments   Labs (all labs ordered are listed, but only abnormal results are displayed) Results for orders placed or performed during the hospital encounter of 04/27/19  CBC  Result Value Ref Range   WBC 9.8 4.0 - 10.5 K/uL   RBC 4.44 4.22 - 5.81 MIL/uL   Hemoglobin 14.9 13.0 - 17.0 g/dL   HCT 42.1 39.0 - 52.0 %   MCV 94.8 80.0 - 100.0 fL   MCH 33.6 26.0 - 34.0 pg   MCHC 35.4 30.0 - 36.0 g/dL   RDW 12.6 11.5 - 15.5 %   Platelets 231 150 - 400 K/uL   nRBC 0.0 0.0 - 0.2 %  Basic metabolic panel  Result Value Ref Range   Sodium 140 135 - 145 mmol/L   Potassium 3.8 3.5 - 5.1 mmol/L   Chloride 109 98 - 111 mmol/L   CO2 22 22 - 32 mmol/L   Glucose, Bld 125 (H) 70 - 99 mg/dL   BUN 8 6 - 20 mg/dL   Creatinine, Ser 1.07 0.61 - 1.24 mg/dL   Calcium 9.4 8.9 - 10.3 mg/dL   GFR calc non Af Amer >60 >60 mL/min   GFR calc Af Amer >60 >60 mL/min   Anion gap 9 5 - 15  CBG monitoring, ED  Result Value Ref Range   Glucose-Capillary 132 (H) 70 - 99 mg/dL    EKG None  Radiology No results found.  Procedures Procedures (including critical care time)  Medications Ordered in ED Medications - No data to display  ED Course  I have reviewed the triage vital signs and the nursing notes.  Pertinent labs & imaging results that were available during my care of the patient were reviewed by me and considered in my medical decision making (see chart for details).    MDM Rules/Calculators/A&P                      Labs sent.   No meds today or prior to arrival. Ibuprofen po. Po fluids.  Reviewed nursing notes and prior charts for additional history.  Recent covid test positive 2 days ago.    Mitchell Romero was evaluated in Emergency Department on 04/27/2019 for the symptoms described in the history of present illness. He was evaluated in the context of the global COVID-19 pandemic, which necessitated consideration that the patient might be at risk for  infection with the SARS-CoV-2 virus that causes COVID-19. Institutional protocols and algorithms that pertain to the evaluation of patients  at risk for COVID-19 are in a state of rapid change based on information released by regulatory bodies including the CDC and federal and state organizations. These policies and algorithms were followed during the patient's care in the ED.  Labs reviewed/interpreted by me - chem normal, wbc normal .  Patient requests d/c to home, states their ride has to go to work. Pt is breathing comfortably. Appears well hydrated and not toxic.   Patient currently appears stable for d/c.   Return precautions provided.    Final Clinical Impression(s) / ED Diagnoses Final diagnoses:  None    Rx / DC Orders ED Discharge Orders    None         Lajean Saver, MD 04/27/19 2002

## 2019-04-27 NOTE — Discharge Instructions (Signed)
It was our pleasure to provide your ER care today - we hope that you feel better.  See covid instructions/precautions.   Take ibuprofen or acetaminophen as need for fever and/or body aches.   Return to ER if worse, new symptoms, increased trouble breathing, or other concern.

## 2019-05-18 ENCOUNTER — Ambulatory Visit (HOSPITAL_COMMUNITY)
Admission: EM | Admit: 2019-05-18 | Discharge: 2019-05-18 | Disposition: A | Discharge status: Home / Self Care | Payer: HRSA Program | Attending: Emergency Medicine | Admitting: Emergency Medicine

## 2019-05-18 ENCOUNTER — Other Ambulatory Visit: Payer: Self-pay

## 2019-05-18 ENCOUNTER — Encounter (HOSPITAL_COMMUNITY): Payer: Self-pay

## 2019-05-18 DIAGNOSIS — R110 Nausea: Secondary | ICD-10-CM | POA: Diagnosis not present

## 2019-05-18 DIAGNOSIS — F17210 Nicotine dependence, cigarettes, uncomplicated: Secondary | ICD-10-CM | POA: Insufficient documentation

## 2019-05-18 DIAGNOSIS — R109 Unspecified abdominal pain: Secondary | ICD-10-CM | POA: Insufficient documentation

## 2019-05-18 DIAGNOSIS — J45909 Unspecified asthma, uncomplicated: Secondary | ICD-10-CM | POA: Diagnosis not present

## 2019-05-18 DIAGNOSIS — Z79899 Other long term (current) drug therapy: Secondary | ICD-10-CM | POA: Insufficient documentation

## 2019-05-18 DIAGNOSIS — Z20822 Contact with and (suspected) exposure to covid-19: Secondary | ICD-10-CM | POA: Insufficient documentation

## 2019-05-18 DIAGNOSIS — I10 Essential (primary) hypertension: Secondary | ICD-10-CM | POA: Insufficient documentation

## 2019-05-18 LAB — SARS CORONAVIRUS 2 (TAT 6-24 HRS): SARS Coronavirus 2: NEGATIVE

## 2019-05-18 MED ORDER — ONDANSETRON 4 MG PO TBDP: 4.0000 mg | ORAL_TABLET | Freq: Three times a day (TID) | ORAL | 0 refills | Status: DC | PRN

## 2019-05-18 NOTE — Discharge Instructions (Signed)
Will notify you of any positive findings from your covid testing. You may monitor your results on your MyChart online as well.   Zofran every 8 hours as needed for nausea or vomiting.   Continue to follow with your primary care provider as needed for persistent symptoms.

## 2019-05-18 NOTE — ED Triage Notes (Signed)
Pt states she is receiving higher doses of medication for her gender transitioning and it causes her to have abdominal upset, which occurred yesterday at work. Pt reports that her employer has asked her to be medically evaluated to be cleared to return to work.  Pt denies any n/v/d, fever, or chills.

## 2019-05-18 NOTE — ED Provider Notes (Signed)
[facility]    CSN: [id number] Arrival date & time: 05/18/19  1729      History   Chief Complaint Chief Complaint  Patient presents with  . return to work  . Abdominal Pain    HPI Mitchell Romero is a 34 y.o. adult.   Mitchell Romero presents with complaints of nausea, with request for return to work clearance. Patient endorses somewhat regular and intermittent nausea. No vomiting. 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 Also with HIV and taking biktarvy, which she states can at time cause some nausea. No vomiting, no abdominal pain, no diarrhea. Left work yesterday due to nausea, however, and work requesting evaluation before return. Feels well today. Has been prescribed nausea medication in the past but hasn't filled this. No fevers. No acute complaints today. No known ill contacts.     ROS per HPI, negative if not otherwise mentioned.      Past Medical History:  Diagnosis Date  . Asthma   . HIV (human immunodeficiency virus infection) ([place])   . Hypertension    PCP took off HTN meds    Patient Active Problem List   Diagnosis Date Noted  . Alcohol abuse with alcohol-induced mood disorder ([place]) 02/13/2018    History reviewed. No pertinent surgical history.     Home Medications    Prior to Admission medications   Medication Sig Start Date End Date Taking? Authorizing Provider  bictegravir-emtricitabine-tenofovir AF (BIKTARVY) 50-200-25 MG TABS tablet Take by mouth.   Yes [provider]  omeprazole (PRILOSEC) 20 MG capsule Take by mouth. 04/13/19  Yes [provider]  valACYclovir (VALTREX) 500 MG tablet Take by mouth.   Yes [provider]  ondansetron (ZOFRAN-ODT) 4 MG disintegrating tablet Take 1 tablet (4 mg total) by mouth every 8 (eight) hours as needed for nausea or vomiting. 05/18/19   Zigmund Gottron, NP    Family History Family History   Problem Relation Age of Onset  . Heart failure Mother   . Stroke Mother   . Diabetes Mother   . Sickle cell trait Mother   . Diabetes Father   . Heart failure Father   . Stroke Father     Social History Social History   Tobacco Use  . Smoking status: Current Every Day Smoker    Packs/day: 0.20    Types: Cigarettes  . Smokeless tobacco: Never Used  Substance Use Topics  . Alcohol use: Yes    Comment: occasional  . Drug use: No     Allergies   Peanut-containing drug products, Food, Other, Latex, and Powder   Review of Systems Review of Systems   Physical Exam Triage Vital Signs ED Triage Vitals  Enc Vitals Group     BP 05/18/19 1807 121/71     Pulse Rate 05/18/19 1807 (!) 107     Resp 05/18/19 1807 20     Temp 05/18/19 1807 98.7 F (37.1 C)     Temp Source 05/18/19 1807 Oral     SpO2 05/18/19 1807 100 %     Weight --      Height --      Head Circumference --      Peak Flow --      Pain Score 05/18/19 1859 0     Pain Loc --      Pain Edu? --      Excl. in [place]? --  No data found.  Updated Vital Signs BP 121/71 (BP Location: Right Arm)   Pulse (!) 107   Temp 98.7 F (37.1 C) (Oral)   Resp 20   SpO2 100%   Physical Exam Constitutional:      General: She is not in acute distress.    Appearance: She is well-developed.  Cardiovascular:     Rate and Rhythm: Normal rate and regular rhythm.     Comments: Noted mild tachycardia on initial presentation  Pulmonary:     Effort: Pulmonary effort is normal.     Breath sounds: Normal breath sounds.  Abdominal:     Tenderness: There is no abdominal tenderness. There is no right CVA tenderness or left CVA tenderness.  Skin:    General: Skin is warm and dry.  Neurological:     Mental Status: She is alert and oriented to person, place, and time.      UC Treatments / Results  Labs (all labs ordered are listed, but only abnormal results are displayed) Labs Reviewed  SARS CORONAVIRUS 2 (TAT 6-24 HRS)     EKG   Radiology No results found.  Procedures Procedures (including critical care time)  Medications Ordered in UC Medications - No data to display  Initial Impression / Assessment and Plan / UC Course  I have reviewed the triage vital signs and the nursing notes.  Pertinent labs & imaging results that were available during my care of the patient were reviewed by me and considered in my medical decision making (see chart for details).     Nausea without vomiting, not necessarily new for her, on hormone therapy as well as biktarvy. No vomiting. No diarrhea, no abdominal pain or fevers. covid screening collected and pending, zofran provided, ok to return to work with results of covid testing. Return precautions provided. Patient verbalized understanding and agreeable to plan.   Final Clinical Impressions(s) / UC Diagnoses   Final diagnoses:  Nausea without vomiting     Discharge Instructions     Will notify you of any positive findings from your covid testing. You may monitor your results on your MyChart online as well.   Zofran every 8 hours as needed for nausea or vomiting.   Continue to follow with your primary care provider as needed for persistent symptoms.     ED Prescriptions    Medication Sig Dispense Auth. Provider   ondansetron (ZOFRAN-ODT) 4 MG disintegrating tablet Take 1 tablet (4 mg total) by mouth every 8 (eight) hours as needed for nausea or vomiting. 12 tablet Zigmund Gottron, NP     PDMP not reviewed this encounter.   Zigmund Gottron, NP 05/18/19 1920

## 2019-06-15 ENCOUNTER — Other Ambulatory Visit: Payer: Self-pay

## 2019-06-15 ENCOUNTER — Emergency Department (HOSPITAL_COMMUNITY)
Admission: EM | Admit: 2019-06-15 | Discharge: 2019-06-16 | Disposition: A | Discharge status: Home / Self Care | Payer: Self-pay | Attending: Emergency Medicine | Admitting: Emergency Medicine

## 2019-06-15 DIAGNOSIS — Z21 Asymptomatic human immunodeficiency virus [HIV] infection status: Secondary | ICD-10-CM | POA: Insufficient documentation

## 2019-06-15 DIAGNOSIS — K0889 Other specified disorders of teeth and supporting structures: Secondary | ICD-10-CM | POA: Insufficient documentation

## 2019-06-15 DIAGNOSIS — Z79899 Other long term (current) drug therapy: Secondary | ICD-10-CM | POA: Insufficient documentation

## 2019-06-15 DIAGNOSIS — F17210 Nicotine dependence, cigarettes, uncomplicated: Secondary | ICD-10-CM | POA: Insufficient documentation

## 2019-06-16 ENCOUNTER — Encounter (HOSPITAL_COMMUNITY): Payer: Self-pay | Admitting: Emergency Medicine

## 2019-06-16 ENCOUNTER — Other Ambulatory Visit: Payer: Self-pay

## 2019-06-16 MED ORDER — ACETAMINOPHEN 500 MG PO TABS
1000.0000 mg | ORAL_TABLET | Freq: Once | ORAL | Status: AC
  Administered 2019-06-16: 1000 mg via ORAL
  Filled 2019-06-16: qty 2

## 2019-06-16 MED ORDER — PENICILLIN V POTASSIUM 500 MG PO TABS: 500.0000 mg | ORAL_TABLET | Freq: Four times a day (QID) | ORAL | 0 refills | Status: DC

## 2019-06-16 MED ORDER — PENICILLIN V POTASSIUM 500 MG PO TABS
500.0000 mg | ORAL_TABLET | Freq: Once | ORAL | Status: AC
  Administered 2019-06-16: 500 mg via ORAL
  Filled 2019-06-16: qty 1

## 2019-06-16 NOTE — ED Triage Notes (Signed)
Patient complaining of left bottom tooth pain. Patient states her gums is swollen.

## 2019-06-16 NOTE — ED Provider Notes (Signed)
[facility] DEPT Provider Note   CSN: [id number] Arrival date & time: 06/15/19  2352     History Chief Complaint  Patient presents with  . Dental Pain    Mitchell Romero is a 34 y.o. adult.  Patient presents to the emergency department with a chief complaint of dental pain. States that they began having dental pain over the past 2 days. Patient reports mild swelling. Patient denies any fever or chills. Patient has history of HIV and takes [employer]. Denies any allergies to any antibiotics. Denies any access to dental follow-up.  The history is provided by the patient. No language interpreter was used.       Past Medical History:  Diagnosis Date  . Asthma   . HIV (human immunodeficiency virus infection) ([place])   . Hypertension    PCP took off HTN meds    Patient Active Problem List   Diagnosis Date Noted  . Alcohol abuse with alcohol-induced mood disorder ([place]) 02/13/2018    History reviewed. No pertinent surgical history.     Family History  Problem Relation Age of Onset  . Heart failure Mother   . Stroke Mother   . Diabetes Mother   . Sickle cell trait Mother   . Diabetes Father   . Heart failure Father   . Stroke Father     Social History   Tobacco Use  . Smoking status: Current Every Day Smoker    Packs/day: 0.20    Types: Cigarettes  . Smokeless tobacco: Never Used  Substance Use Topics  . Alcohol use: Yes    Comment: occasional  . Drug use: No    Home Medications Prior to Admission medications   Medication Sig Start Date End Date Taking? Authorizing Provider  bictegravir-emtricitabine-tenofovir AF (BIKTARVY) 50-200-25 MG TABS tablet Take by mouth.    [provider]  omeprazole (PRILOSEC) 20 MG capsule Take by mouth. 04/13/19   [provider]  ondansetron (ZOFRAN-ODT) 4 MG disintegrating tablet Take 1 tablet (4 mg total) by mouth every 8 (eight) hours as needed for nausea or vomiting. 05/18/19    Zigmund Gottron, NP  valACYclovir (VALTREX) 500 MG tablet Take by mouth.    [provider]    Allergies    Peanut-containing drug products, Food, Other, Latex, and Powder  Review of Systems   Review of Systems  Constitutional: Negative for chills and fever.  HENT: Positive for dental problem. Negative for drooling.   Neurological: Negative for speech difficulty.  Psychiatric/Behavioral: Positive for sleep disturbance.    Physical Exam Updated Vital Signs BP 120/80   Pulse 91   Temp 99.6 F (37.6 C) (Oral)   Resp 14   Ht 5\' 6"  (1.676 m)   Wt 91.6 kg   SpO2 95%   BMI 32.60 kg/m   Physical Exam Physical Exam  Constitutional: Pt appears well-developed and well-nourished.  HENT:  Head: Normocephalic.  Right Ear: Tympanic membrane, external ear and ear canal normal.  Left Ear: Tympanic membrane, external ear and ear canal normal.  Nose: Nose normal. Right sinus exhibits no maxillary sinus tenderness and no frontal sinus tenderness. Left sinus exhibits no maxillary sinus tenderness and no frontal sinus tenderness.  Mouth/Throat: Uvula is midline, oropharynx is clear and moist and mucous membranes are normal. No oral lesions. No uvula swelling or lacerations. No oropharyngeal exudate, posterior oropharyngeal edema, posterior oropharyngeal erythema or tonsillar abscesses.  Poor dentition No gingival swelling, fluctuance or induration No gross abscess  No  sublingual edema, tenderness to palpation, or sign of Ludwig's angina, or deep space infection Pain at left lower rear molar Eyes: Conjunctivae are normal. Pupils are equal, round, and reactive to light. Right eye exhibits no discharge. Left eye exhibits no discharge.  Neck: Normal range of motion. Neck supple.  No stridor Handling secretions without difficulty No nuchal rigidity No cervical lymphadenopathy Cardiovascular: Normal rate, regular rhythm and normal heart sounds.   Pulmonary/Chest: Effort normal. No  respiratory distress.  Equal chest rise  Abdominal: Soft. Bowel sounds are normal. Pt exhibits no distension. There is no tenderness.  Lymphadenopathy: Pt has no cervical adenopathy.  Neurological: Pt is alert and oriented x 4  Skin: Skin is warm and dry.  Psychiatric: Pt has a normal mood and affect.  Nursing note and vitals reviewed.   ED Results / Procedures / Treatments   Labs (all labs ordered are listed, but only abnormal results are displayed) Labs Reviewed - No data to display  EKG None  Radiology No results found.  Procedures Procedures (including critical care time)  Medications Ordered in ED Medications  penicillin v potassium (VEETID) tablet 500 mg (has no administration in time range)    ED Course  I have reviewed the triage vital signs and the nursing notes.  Pertinent labs & imaging results that were available during my care of the patient were reviewed by me and considered in my medical decision making (see chart for details).    MDM Rules/Calculators/A&P                      Patient with dentalgia.  No abscess requiring immediate incision and drainage.  Exam not concerning for Ludwig's angina or pharyngeal abscess.  Will treat with penicillin. Pt instructed to follow-up with dentist.  Discussed return precautions. Pt safe for discharge.  Final Clinical Impression(s) / ED Diagnoses Final diagnoses:  Pain, dental    Rx / DC Orders ED Discharge Orders    None       Montine Circle, PA-C 06/16/19 0115    Fatima Blank, MD 06/16/19 [phone]

## 2019-07-28 ENCOUNTER — Emergency Department (HOSPITAL_COMMUNITY)
Admission: EM | Admit: 2019-07-28 | Discharge: 2019-07-28 | Disposition: A | Discharge status: Home / Self Care | Payer: Self-pay | Attending: Emergency Medicine | Admitting: Emergency Medicine

## 2019-07-28 ENCOUNTER — Encounter (HOSPITAL_COMMUNITY): Payer: Self-pay | Admitting: Emergency Medicine

## 2019-07-28 ENCOUNTER — Other Ambulatory Visit: Payer: Self-pay

## 2019-07-28 DIAGNOSIS — Z5321 Procedure and treatment not carried out due to patient leaving prior to being seen by health care provider: Secondary | ICD-10-CM | POA: Insufficient documentation

## 2019-07-28 DIAGNOSIS — R197 Diarrhea, unspecified: Secondary | ICD-10-CM | POA: Insufficient documentation

## 2019-07-28 DIAGNOSIS — R109 Unspecified abdominal pain: Secondary | ICD-10-CM | POA: Insufficient documentation

## 2019-07-28 LAB — COMPREHENSIVE METABOLIC PANEL
ALT: 26 U/L (ref 0–44)
AST: 31 U/L (ref 15–41)
Albumin: 3.7 g/dL (ref 3.5–5.0)
Alkaline Phosphatase: 50 U/L (ref 38–126)
Anion gap: 12 (ref 5–15)
BUN: 12 mg/dL (ref 6–20)
CO2: 23 mmol/L (ref 22–32)
Calcium: 9 mg/dL (ref 8.9–10.3)
Chloride: 101 mmol/L (ref 98–111)
Creatinine, Ser: 0.91 mg/dL (ref 0.61–1.24)
GFR calc Af Amer: >60 mL/min (ref >60)
GFR calc non Af Amer: >60 mL/min (ref >60)
Glucose, Bld: 121 mg/dL — ABNORMAL HIGH (ref 70–99)
Potassium: 3.7 mmol/L (ref 3.5–5.1)
Sodium: 136 mmol/L (ref 135–145)
Total Bilirubin: 0.4 mg/dL (ref 0.3–1.2)
Total Protein: 6.7 g/dL (ref 6.5–8.1)

## 2019-07-28 LAB — LIPASE, BLOOD: Lipase: 244 U/L — ABNORMAL HIGH (ref 11–51)

## 2019-07-28 LAB — CBC
HCT: 43.1 % (ref 39.0–52.0)
Hemoglobin: 15.4 g/dL (ref 13.0–17.0)
MCH: 33.4 pg (ref 26.0–34.0)
MCHC: 35.7 g/dL (ref 30.0–36.0)
MCV: 93.5 fL (ref 80.0–100.0)
Platelets: 312 10*3/uL (ref 150–400)
RBC: 4.61 MIL/uL (ref 4.22–5.81)
RDW: 11.9 % (ref 11.5–15.5)
WBC: 8.5 10*3/uL (ref 4.0–10.5)
nRBC: 0 % (ref 0.0–0.2)

## 2019-07-28 MED ORDER — SODIUM CHLORIDE 0.9% FLUSH: 3.0000 mL | Freq: Once | INTRAVENOUS | Status: DC

## 2019-07-28 NOTE — ED Triage Notes (Signed)
Pt c/o abd pain with diarrhea since last night. Pt states he is not taking his regular medication./

## 2019-07-28 NOTE — ED Notes (Signed)
Pt didn't answered when it was called in the lobby

## 2020-02-08 ENCOUNTER — Other Ambulatory Visit: Payer: Self-pay

## 2020-02-08 ENCOUNTER — Ambulatory Visit (HOSPITAL_COMMUNITY)
Admission: EM | Admit: 2020-02-08 | Discharge: 2020-02-08 | Disposition: A | Discharge status: Home / Self Care | Payer: Self-pay | Attending: Family Medicine | Admitting: Family Medicine

## 2020-02-08 DIAGNOSIS — R110 Nausea: Secondary | ICD-10-CM

## 2020-02-08 DIAGNOSIS — R52 Pain, unspecified: Secondary | ICD-10-CM | POA: Insufficient documentation

## 2020-02-08 DIAGNOSIS — F17210 Nicotine dependence, cigarettes, uncomplicated: Secondary | ICD-10-CM | POA: Insufficient documentation

## 2020-02-08 DIAGNOSIS — K0381 Cracked tooth: Secondary | ICD-10-CM | POA: Insufficient documentation

## 2020-02-08 DIAGNOSIS — Z79899 Other long term (current) drug therapy: Secondary | ICD-10-CM | POA: Insufficient documentation

## 2020-02-08 DIAGNOSIS — R630 Anorexia: Secondary | ICD-10-CM | POA: Insufficient documentation

## 2020-02-08 DIAGNOSIS — Z20822 Contact with and (suspected) exposure to covid-19: Secondary | ICD-10-CM | POA: Insufficient documentation

## 2020-02-08 DIAGNOSIS — R112 Nausea with vomiting, unspecified: Secondary | ICD-10-CM | POA: Insufficient documentation

## 2020-02-08 DIAGNOSIS — K0889 Other specified disorders of teeth and supporting structures: Secondary | ICD-10-CM

## 2020-02-08 LAB — RESP PANEL BY RT-PCR (FLU A&B, COVID) ARPGX2
Influenza A by PCR: NEGATIVE
Influenza B by PCR: NEGATIVE
SARS Coronavirus 2 by RT PCR: NEGATIVE

## 2020-02-08 MED ORDER — ONDANSETRON 4 MG PO TBDP
4.0000 mg | ORAL_TABLET | Freq: Three times a day (TID) | ORAL | 0 refills | Status: DC | PRN
Start: 2020-02-08 — End: 2021-04-29

## 2020-02-08 MED ORDER — PENICILLIN V POTASSIUM 500 MG PO TABS
500.0000 mg | ORAL_TABLET | Freq: Four times a day (QID) | ORAL | 0 refills | Status: DC
Start: 2020-02-08 — End: 2021-04-28

## 2020-02-08 NOTE — ED Triage Notes (Signed)
Pt states that she felt nauseous at work and had body aches yesterday. Pt states she feels fine today but needs a covid test and to be cleared  to return back to work.

## 2020-02-08 NOTE — ED Provider Notes (Signed)
[facility]    CSN: [id number] Arrival date & time: 02/08/20  [place]      History   Chief Complaint Chief Complaint  Patient presents with  . Nausea    HPI Mitchell Romero is a 34 y.o. adult.   Presenting today with 1 day history of nausea, generalized body aches and anorexia. States sxs started at work and continued last night, mild sxs today. Denies fever, chills, cough, congestion, sore throat, abdominal pain, vomiting, diarrhea. Not trying anything OTC for sxs. Works with [employer], unsure if sick contacts. Needing COVID test to return to work.      Past Medical History:  Diagnosis Date  . Asthma   . HIV (human immunodeficiency virus infection) ([place])   . Hypertension    PCP took off HTN meds    Patient Active Problem List   Diagnosis Date Noted  . Alcohol abuse with alcohol-induced mood disorder ([place]) 02/13/2018    No past surgical history on file.     Home Medications    Prior to Admission medications   Medication Sig Start Date End Date Taking? Authorizing Provider  bictegravir-emtricitabine-tenofovir AF (BIKTARVY) 50-200-25 MG TABS tablet Take by mouth.    [provider]  omeprazole (PRILOSEC) 20 MG capsule Take by mouth. 04/13/19   [provider]  ondansetron (ZOFRAN-ODT) 4 MG disintegrating tablet Take 1 tablet (4 mg total) by mouth every 8 (eight) hours as needed for nausea or vomiting. 02/08/20   Volney American, PA-C  penicillin v potassium (VEETID) 500 MG tablet Take 1 tablet (500 mg total) by mouth 4 (four) times daily. 02/08/20   Volney American, PA-C  valACYclovir (VALTREX) 500 MG tablet Take by mouth.    [provider]    Family History Family History  Problem Relation Age of Onset  . Heart failure Mother   . Stroke Mother   . Diabetes Mother   . Sickle cell trait Mother   . Diabetes Father   . Heart failure Father   . Stroke Father     Social History Social History   Tobacco Use   . Smoking status: Current Every Day Smoker    Packs/day: 0.20    Types: Cigarettes  . Smokeless tobacco: Never Used  Vaping Use  . Vaping Use: Never used  Substance Use Topics  . Alcohol use: Yes    Comment: occasional  . Drug use: No     Allergies   Peanut-containing drug products, Food, Other, Latex, and Powder   Review of Systems Review of Systems PER HPI    Physical Exam Triage Vital Signs ED Triage Vitals  Enc Vitals Group     BP 02/08/20 1723 129/72     Pulse Rate 02/08/20 1723 88     Resp 02/08/20 1723 18     Temp 02/08/20 1723 98.8 F (37.1 C)     Temp Source 02/08/20 1723 Oral     SpO2 02/08/20 1723 100 %     Weight --      Height --      Head Circumference --      Peak Flow --      Pain Score 02/08/20 1721 0     Pain Loc --      Pain Edu? --      Excl. in [place]? --    No data found.  Updated Vital Signs BP 129/72 (BP Location: Right Arm)   Pulse 88   Temp 98.8 F (37.1  C) (Oral)   Resp 18   SpO2 100%   Visual Acuity Right Eye Distance:   Left Eye Distance:   Bilateral Distance:    Right Eye Near:   Left Eye Near:    Bilateral Near:     Physical Exam Vitals and nursing note reviewed.  Constitutional:      Appearance: Normal appearance.  HENT:     Head: Atraumatic.     Nose: Nose normal.     Mouth/Throat:     Mouth: Mucous membranes are moist.     Pharynx: Oropharynx is clear.  Eyes:     Extraocular Movements: Extraocular movements intact.     Conjunctiva/sclera: Conjunctivae normal.  Cardiovascular:     Rate and Rhythm: Normal rate and regular rhythm.  Pulmonary:     Effort: Pulmonary effort is normal.     Breath sounds: Normal breath sounds. No wheezing or rales.  Abdominal:     General: Bowel sounds are normal. There is no distension.     Palpations: Abdomen is soft.     Tenderness: There is no abdominal tenderness. There is no guarding.  Musculoskeletal:        General: Normal range of motion.     Cervical back: Normal  range of motion and neck supple.  Skin:    General: Skin is warm and dry.  Neurological:     General: No focal deficit present.     Mental Status: She is oriented to person, place, and time.  Psychiatric:        Mood and Affect: Mood normal.        Thought Content: Thought content normal.        Judgment: Judgment normal.      UC Treatments / Results  Labs (all labs ordered are listed, but only abnormal results are displayed) Labs Reviewed  RESP PANEL BY RT-PCR (FLU A&B, COVID) [id number]    EKG   Radiology No results found.  Procedures Procedures (including critical care time)  Medications Ordered in UC Medications - No data to display  Initial Impression / Assessment and Plan / UC Course  I have reviewed the triage vital signs and the nursing notes.  Pertinent labs & imaging results that were available during my care of the patient were reviewed by me and considered in my medical decision making (see chart for details).     Overall well appearing, vitals and exam reassuring and stable. Resp panel pending, zofran sent for prn nausea. Discussed OTC pain relievers, supportive home care, return precautions. Work note given.  Also requesting refill on PCN for an ongoing dental issue from some cracked teeth - awaiting dental appt for this and has been on PCN the past month or so as he gets infections when he comes off of it. Tolerating well. No current pain or swelling.   Final Clinical Impressions(s) / UC Diagnoses   Final diagnoses:  Nausea  Generalized body aches  Pain, dental   Discharge Instructions   None    ED Prescriptions    Medication Sig Dispense Auth. Provider   penicillin v potassium (VEETID) 500 MG tablet Take 1 tablet (500 mg total) by mouth 4 (four) times daily. 28 tablet Volney American, PA-C   ondansetron (ZOFRAN-ODT) 4 MG disintegrating tablet Take 1 tablet (4 mg total) by mouth every 8 (eight) hours as needed for nausea or vomiting. 21  tablet Volney American, [place]     PDMP not reviewed this encounter.   Orene Desanctis,  Lilia Argue, PA-C 02/08/20 1903

## 2020-09-21 ENCOUNTER — Encounter (HOSPITAL_COMMUNITY): Payer: Self-pay | Admitting: Emergency Medicine

## 2020-09-21 ENCOUNTER — Emergency Department (HOSPITAL_COMMUNITY)
Admission: EM | Admit: 2020-09-21 | Discharge: 2020-09-21 | Disposition: A | Discharge status: Home / Self Care | Payer: Self-pay | Attending: Emergency Medicine | Admitting: Emergency Medicine

## 2020-09-21 ENCOUNTER — Other Ambulatory Visit: Payer: Self-pay

## 2020-09-21 DIAGNOSIS — R1084 Generalized abdominal pain: Secondary | ICD-10-CM

## 2020-09-21 DIAGNOSIS — R531 Weakness: Secondary | ICD-10-CM

## 2020-09-21 DIAGNOSIS — Z8507 Personal history of malignant neoplasm of pancreas: Secondary | ICD-10-CM | POA: Insufficient documentation

## 2020-09-21 DIAGNOSIS — R112 Nausea with vomiting, unspecified: Secondary | ICD-10-CM | POA: Insufficient documentation

## 2020-09-21 DIAGNOSIS — Z21 Asymptomatic human immunodeficiency virus [HIV] infection status: Secondary | ICD-10-CM | POA: Insufficient documentation

## 2020-09-21 HISTORY — DX: Human immunodeficiency virus (HIV) disease: B20

## 2020-09-21 HISTORY — DX: Malignant neoplasm of pancreas, unspecified: C25.9

## 2020-09-21 LAB — CBC WITH DIFFERENTIAL/PLATELET
Abs Immature Granulocytes: 0.02 10*3/uL (ref 0.00–0.07)
Basophils Absolute: 0 10*3/uL (ref 0.0–0.1)
Basophils Relative: 0 %
Eosinophils Absolute: 0.3 10*3/uL (ref 0.0–0.5)
Eosinophils Relative: 3 %
HCT: 43.7 % (ref 39.0–52.0)
Hemoglobin: 15.1 g/dL (ref 13.0–17.0)
Immature Granulocytes: 0 %
Lymphocytes Relative: 26 %
Lymphs Abs: 2.4 10*3/uL (ref 0.7–4.0)
MCH: 33 pg (ref 26.0–34.0)
MCHC: 34.6 g/dL (ref 30.0–36.0)
MCV: 95.4 fL (ref 80.0–100.0)
Monocytes Absolute: 0.5 10*3/uL (ref 0.1–1.0)
Monocytes Relative: 5 %
Neutro Abs: 6 10*3/uL (ref 1.7–7.7)
Neutrophils Relative %: 66 %
Platelets: 280 10*3/uL (ref 150–400)
RBC: 4.58 MIL/uL (ref 4.22–5.81)
RDW: 12.4 % (ref 11.5–15.5)
WBC: 9.2 10*3/uL (ref 4.0–10.5)
nRBC: 0 % (ref 0.0–0.2)

## 2020-09-21 LAB — COMPREHENSIVE METABOLIC PANEL
ALT: 25 U/L (ref 0–44)
AST: 24 U/L (ref 15–41)
Albumin: 4 g/dL (ref 3.5–5.0)
Alkaline Phosphatase: 43 U/L (ref 38–126)
Anion gap: 7 (ref 5–15)
BUN: 8 mg/dL (ref 6–20)
CO2: 24 mmol/L (ref 22–32)
Calcium: 9.4 mg/dL (ref 8.9–10.3)
Chloride: 108 mmol/L (ref 98–111)
Creatinine, Ser: 0.85 mg/dL (ref 0.61–1.24)
GFR, Estimated: >60 mL/min (ref >60)
Glucose, Bld: 152 mg/dL — ABNORMAL HIGH (ref 70–99)
Potassium: 4.1 mmol/L (ref 3.5–5.1)
Sodium: 139 mmol/L (ref 135–145)
Total Bilirubin: 0.6 mg/dL (ref 0.3–1.2)
Total Protein: 6.9 g/dL (ref 6.5–8.1)

## 2020-09-21 LAB — URINALYSIS, ROUTINE W REFLEX MICROSCOPIC
Bilirubin Urine: NEGATIVE
Glucose, UA: NEGATIVE mg/dL
Hgb urine dipstick: NEGATIVE
Ketones, ur: NEGATIVE mg/dL
Leukocytes,Ua: NEGATIVE
Nitrite: NEGATIVE
Protein, ur: NEGATIVE mg/dL
Specific Gravity, Urine: 1.019 (ref 1.005–1.030)
pH: 6 (ref 5.0–8.0)

## 2020-09-21 LAB — LIPASE, BLOOD: Lipase: 45 U/L (ref 11–51)

## 2020-09-21 NOTE — Discharge Instructions (Addendum)
As we discussed, your labs today looked reassuring.  Urine did not show any signs of infection, your pancreatic enzyme was normal and your blood work looked reassuring.  It is very important for you to follow-up with infectious disease.  Please call your office in [place] and arrange an appointment.  Have also given you referral to our infectious disease doctors.  Return the emergency department for any worsening pain, vomiting, difficulty breathing or any other worsening or concerning symptoms.

## 2020-09-21 NOTE — ED Triage Notes (Signed)
Pt states was diagnosed with pancreatic cancer in [place] 1 month ago and stopped HIV meds at that time.  Has not been seen for follow-up.  Reports mid abd pain x 1 month, nausea, vomiting, and constipation.  Having to use enemas for BM.

## 2020-09-21 NOTE — ED Provider Notes (Signed)
[facility] EMERGENCY DEPARTMENT Provider Note   CSN: [id number] Arrival date & time: 09/21/20  1225     History Chief Complaint  Patient presents with   Abdominal Pain    Mitchell Romero is a 35 y.o. male with PMH/o HIV who presents for evaluation of generalized abdominal pain, nausea/vomiting, generalized weakness that has been ongoing intermittently for the last 2 months.  Patient states that he was previously on [place].  He was in [place] recently for a prolonged period of time and states that he was in the hospital.  He states that they did blood work and told that "something was wrong with his pancreas."  He is unsure if it was pancreatic cancer versus pancreatitis.  He was told to stop his Biktarvy medications as that might be causing his pancreatic issues.  He states he has not taken them for about 1.5 months.  He states that intermittently over that time, he is weak, tired, fatigued and will occasionally have some nausea/vomiting.  He states he has been able to eat and has not had any weight loss.  Denies any fevers.  He states he will occasionally have some abdominal pain as well.  He denies any chest pain, difficulty breathing, urinary complaints.  His infectious disease doctor is in [place].  He states that he has not seen them since April.  His last CD4 count was in April and he was undetectable at that time.  The history is provided by the patient.      Past Medical History:  Diagnosis Date   HIV (human immunodeficiency virus infection) ([place])    Pancreatic cancer ([place])     There are no problems to display for this patient.   History reviewed. No pertinent surgical history.     No family history on file.  Social History   Tobacco Use   Smoking status: Never   Smokeless tobacco: Never  Substance Use Topics   Alcohol use: Not Currently   Drug use: Not Currently    Home Medications Prior to Admission medications   Not on File     Allergies    Patient has no allergy information on record.  Review of Systems   Review of Systems  Constitutional:  Negative for fever and unexpected weight change.  Respiratory:  Negative for cough and shortness of breath.   Cardiovascular:  Negative for chest pain.  Gastrointestinal:  Positive for nausea and vomiting. Negative for abdominal pain, anal bleeding and diarrhea.  Genitourinary:  Negative for dysuria and hematuria.  Neurological:  Positive for weakness (generalized). Negative for headaches.  All other systems reviewed and are negative.  Physical Exam Updated Vital Signs BP 128/82 (BP Location: Right Arm)   Pulse 87   Temp 98.8 F (37.1 C) (Oral)   Resp 16   SpO2 98%   Physical Exam Vitals and nursing note reviewed.  Constitutional:      Appearance: Normal appearance. He is well-developed.     Comments: Well appearing.   HENT:     Head: Normocephalic and atraumatic.     Comments: PERRL. EOMs intact. No nystagmus. No neglect.  Eyes:     General: Lids are normal.     Conjunctiva/sclera: Conjunctivae normal.     Pupils: Pupils are equal, round, and reactive to light.  Cardiovascular:     Rate and Rhythm: Normal rate and regular rhythm.     Pulses: Normal pulses.     Heart sounds: Normal heart sounds. No murmur  heard.   No friction rub. No gallop.  Pulmonary:     Effort: Pulmonary effort is normal.     Breath sounds: Normal breath sounds.     Comments: Lungs clear to auscultation bilaterally.  Symmetric chest rise.  No wheezing, rales, rhonchi. Abdominal:     Palpations: Abdomen is soft. Abdomen is not rigid.     Tenderness: There is no abdominal tenderness. There is no guarding.     Comments: Abdomen is soft, non-distended, non-tender. No rigidity, No guarding. No peritoneal signs.  No CVA tenderness.  Musculoskeletal:        General: Normal range of motion.     Cervical back: Full passive range of motion without pain.  Skin:    General: Skin is warm  and dry.     Capillary Refill: Capillary refill takes less than 2 seconds.  Neurological:     Mental Status: He is alert and oriented to person, place, and time.  Psychiatric:        Speech: Speech normal.    ED Results / Procedures / Treatments   Labs (all labs ordered are listed, but only abnormal results are displayed) Labs Reviewed  COMPREHENSIVE METABOLIC PANEL - Abnormal; Notable for the following components:      Result Value   Glucose, Bld 152 (*)    All other components within normal limits  CBC WITH DIFFERENTIAL/PLATELET  LIPASE, BLOOD  URINALYSIS, ROUTINE W REFLEX MICROSCOPIC    EKG None  Radiology No results found.  Procedures Procedures   Medications Ordered in ED Medications - No data to display  ED Course  I have reviewed the triage vital signs and the nursing notes.  Pertinent labs & imaging results that were available during my care of the patient were reviewed by me and considered in my medical decision making (see chart for details).    MDM Rules/Calculators/A&P                           35 y.o. F with PMH/o HIV who presents for evaluation of abdominal pain, generalized weakness, nausea/vomiting that has been intermittently occurring for the last month or so.  He states that he was in [place] recently and was told that he had "an issue with his pancreas."  He is unsure if it is pancreatitis or pancreatic cancer.  He was told to stop his Biktarvy as this might be causing his pancreas issues.  He has not taken it for about a month and a half.  He has not followed up with anybody.  He states over the last month and a half, he has had generalized weakness, fatigue.  He also reports that sometimes he will have some intermittent nausea/vomiting.  He denies any fevers, weight loss.  On initial arrival, he is afebrile, nontoxic-appearing.  Vital signs are stable.  On exam, abdomen is benign. Labs ordered at triage.   I reviewed patient's record.  No prior  documentation of pancreatic issues.  No prior history in care everywhere.  UA negative for any infectious etiology. Lipase is normal. CMP shows normal BUN/Cr. Glucose is 152. CBC shows no leukocytosis, Hgb is normal.   I discussed results with patient.  Patient is well-appearing and is not vomiting at this time.  Lab work is very reassuring.  I did discuss with him that he might be fatigued because he is off his Biktarvy medication.  Unfortunately, I reviewed his records and there is no mention  of his CD4 count, mention of his previous HIV status.  He has an infectious disease doctor he sees in [place].  At this time, given his reassuring appearance, reassuring lab work here today, and the fact that I cannot confirm that he has been on [place] previously or has history of HIV, I discussed with him that the best plan would be following up with his infectious disease doctor to restart his Biktarvy.  I discussed with him that he should call them tomorrow to arrange an appointment.  He does have an appoint with them next week.  I discussed with him regarding seeing if he can make it earlier.  I did offer him a CT scan for further evaluation of his past pancreatic issues.  Patient declined and states that he did not want to wait for CT ab pelvis.  I did discuss with him the risk first benefits of declining CT ab pelvis, including but not limited to missed diagnosis, worsening condition.  Patient engaged in shared decision-making and opted to decline CT abdomen pelvis.  Patient exhibits full medical decision-making capacity. Patient will be discharged home with ID follow up. Discussed patient with Dr. Tyrone Nine who is agreeable to plan. Patient had ample opportunity for questions and discussion. All patient's questions were answered with full understanding. Strict return precautions discussed. Patient expresses understanding and agreement to plan.   Portions of this note were generated with [employer].  Dictation errors may occur despite best attempts at proofreading.   Final Clinical Impression(s) / ED Diagnoses Final diagnoses:  Generalized abdominal pain  Generalized weakness    Rx / DC Orders ED Discharge Orders     None        Desma Mcgregor 09/21/20 2104    Deno Etienne, DO 09/21/20 2309

## 2020-09-21 NOTE — ED Provider Notes (Signed)
Emergency Medicine Provider Triage Evaluation Note  Mitchell Romero , a 35 y.o. male  was evaluated in triage.  Pt complains of abd pain. Pain x 1 month. Was seen in [facility] and states was told she had pancreatic CANCER, not pancreatitis. Told to stop HIV meds at that time. Since has had pain, NBNB emesis, constipation  Review of Systems  Positive: Abd pain, constipation Negative: Back pain, weakness  Physical Exam  BP 127/77 (BP Location: Right Arm)   Pulse 80   Temp 98.8 F (37.1 C) (Oral)   Resp 19   SpO2 98%  Gen:   Awake, no distress   Resp:  Normal effort  MSK:   Moves extremities without difficulty  Other:    Medical Decision Making  Medically screening exam initiated at 12:42 PM.  Appropriate orders placed.  Bentlee Derrell Kitson was informed that the remainder of the evaluation will be completed by another provider, this initial triage assessment does not replace that evaluation, and the importance of remaining in the ED until their evaluation is complete.  Abd pain, constipation   Keeton Kassebaum A, PA-C 09/21/20 1243    Wyvonnia Dusky, MD 09/22/20 [phone]

## 2020-11-23 ENCOUNTER — Encounter (HOSPITAL_COMMUNITY): Payer: Self-pay | Admitting: Emergency Medicine

## 2020-11-23 ENCOUNTER — Other Ambulatory Visit: Payer: Self-pay

## 2020-11-23 ENCOUNTER — Ambulatory Visit (HOSPITAL_COMMUNITY)
Admission: EM | Admit: 2020-11-23 | Discharge: 2020-11-23 | Disposition: A | Discharge status: Home / Self Care | Payer: Self-pay

## 2020-11-23 DIAGNOSIS — R110 Nausea: Secondary | ICD-10-CM

## 2020-11-23 NOTE — Discharge Instructions (Addendum)
Please follow up with infectious disease for any medications to treat HIV, including Biktarvy, and for reevaluation.   Go to the Emergency Department if symptoms worsen or do not improve in the next few days.

## 2020-11-23 NOTE — ED Provider Notes (Signed)
[facility]    CSN: [id number] Arrival date & time: 11/23/20  1100      History   Chief Complaint Chief Complaint  Patient presents with   Nausea   Headache    HPI Mitchell Romero is a 35 y.o. male.   Patient here reporting nausea and headache that have been ongoing for the past several months since stopping Biktarvy.  Patient was evaluated at the ED in July for the same.  At that time patient reported that they were instructed to stop Biktarvy as it could be causing pancreas issues.  ED note reports that they were diagnosed with either pancreatitis or pancreatic cancer.  Reports that they have an infectious disease doctor in [place] but missed last appointment due to being in [place] taking care of a family member.  Other than ED record in July, no additional medical records were able to be obtained at this time to confirm history of HIV and pancreatitis/pancreatitic cancer.  The history is provided by the patient.  Headache Associated symptoms: nausea    Past Medical History:  Diagnosis Date   HIV (human immunodeficiency virus infection) ([place])    Pancreatic cancer ([place])     There are no problems to display for this patient.   History reviewed. No pertinent surgical history.     Home Medications    Prior to Admission medications   Medication Sig Start Date End Date Taking? Authorizing Provider  Bictegravir-Emtricitab-Tenofov (BIKTARVY PO) Take by mouth.   Yes [provider]    Family History History reviewed. No pertinent family history.  Social History Social History   Tobacco Use   Smoking status: Never   Smokeless tobacco: Never  Substance Use Topics   Alcohol use: Not Currently   Drug use: Not Currently     Allergies   Patient has no known allergies.   Review of Systems Review of Systems  Gastrointestinal:  Positive for nausea.  Neurological:  Positive for headaches.  All other systems reviewed and are  negative.   Physical Exam Triage Vital Signs ED Triage Vitals  Enc Vitals Group     BP 11/23/20 1127 115/70     Pulse Rate 11/23/20 1127 (!) 39     Resp 11/23/20 1127 17     Temp 11/23/20 1127 98.5 F (36.9 C)     Temp Source 11/23/20 1127 Oral     SpO2 11/23/20 1127 96 %     Weight --      Height --      Head Circumference --      Peak Flow --      Pain Score 11/23/20 1125 7     Pain Loc --      Pain Edu? --      Excl. in [place]? --    No data found.  Updated Vital Signs BP 115/70 (BP Location: Right Arm)   Pulse (!) 39   Temp 98.5 F (36.9 C) (Oral)   Resp 17   SpO2 96%   Visual Acuity Right Eye Distance:   Left Eye Distance:   Bilateral Distance:    Right Eye Near:   Left Eye Near:    Bilateral Near:     Physical Exam Vitals and nursing note reviewed.  Constitutional:      General: He is not in acute distress.    Appearance: Normal appearance. He is not ill-appearing, toxic-appearing or diaphoretic.  HENT:     Head: Normocephalic and atraumatic.  Eyes:  Conjunctiva/sclera: Conjunctivae normal.  Cardiovascular:     Rate and Rhythm: Normal rate.     Pulses: Normal pulses.  Pulmonary:     Effort: Pulmonary effort is normal.  Abdominal:     General: Abdomen is flat.  Musculoskeletal:        General: Normal range of motion.     Cervical back: Normal range of motion.  Skin:    General: Skin is warm and dry.  Neurological:     General: No focal deficit present.     Mental Status: He is alert and oriented to person, place, and time.     GCS: GCS eye subscore is 4. GCS verbal subscore is 5. GCS motor subscore is 6.     Coordination: Coordination normal.     Gait: Gait normal.  Psychiatric:        Mood and Affect: Mood normal.     UC Treatments / Results  Labs (all labs ordered are listed, but only abnormal results are displayed) Labs Reviewed - No data to display  EKG   Radiology No results found.  Procedures Procedures (including  critical care time)  Medications Ordered in UC Medications - No data to display  Initial Impression / Assessment and Plan / UC Course  I have reviewed the triage vital signs and the nursing notes.  Pertinent labs & imaging results that were available during my care of the patient were reviewed by me and considered in my medical decision making (see chart for details).    Discussed with patient that they would need to follow-up with infectious disease for further evaluation of HIV and for any medication refills.  Offered to obtain blood work including a CBC and CMP but patient declined at this time.  Lab work in July was within normal limits.  Referral placed for [place] infectious disease. Final Clinical Impressions(s) / UC Diagnoses   Final diagnoses:  Nausea without vomiting     Discharge Instructions      Please follow up with infectious disease for any medications to treat HIV, including Biktarvy, and for reevaluation.   Go to the Emergency Department if symptoms worsen or do not improve in the next few days.       ED Prescriptions   None    PDMP not reviewed this encounter.   Pearson Forster, NP 11/23/20 1230

## 2021-03-11 ENCOUNTER — Other Ambulatory Visit: Payer: Self-pay

## 2021-03-11 ENCOUNTER — Ambulatory Visit: Payer: Self-pay | Admitting: Physician Assistant

## 2021-03-11 ENCOUNTER — Ambulatory Visit: Payer: Self-pay | Admitting: Pharmacist

## 2021-03-11 ENCOUNTER — Ambulatory Visit: Payer: Self-pay

## 2021-03-16 ENCOUNTER — Ambulatory Visit: Payer: Self-pay | Admitting: Physician Assistant

## 2021-03-16 ENCOUNTER — Ambulatory Visit: Payer: Self-pay | Admitting: Pharmacist

## 2021-03-16 ENCOUNTER — Ambulatory Visit: Payer: Self-pay

## 2021-03-20 ENCOUNTER — Other Ambulatory Visit: Payer: Self-pay

## 2021-03-20 ENCOUNTER — Emergency Department (HOSPITAL_COMMUNITY)
Admission: EM | Admit: 2021-03-20 | Discharge: 2021-03-21 | Disposition: A | Discharge status: Home / Self Care | Payer: Self-pay | Attending: Emergency Medicine | Admitting: Emergency Medicine

## 2021-03-20 ENCOUNTER — Encounter (HOSPITAL_COMMUNITY): Payer: Self-pay | Admitting: Student

## 2021-03-20 ENCOUNTER — Emergency Department (HOSPITAL_COMMUNITY): Payer: Self-pay

## 2021-03-20 DIAGNOSIS — K0889 Other specified disorders of teeth and supporting structures: Secondary | ICD-10-CM

## 2021-03-20 DIAGNOSIS — M25572 Pain in left ankle and joints of left foot: Secondary | ICD-10-CM

## 2021-03-20 DIAGNOSIS — Z79899 Other long term (current) drug therapy: Secondary | ICD-10-CM | POA: Insufficient documentation

## 2021-03-20 DIAGNOSIS — R2242 Localized swelling, mass and lump, left lower limb: Secondary | ICD-10-CM | POA: Insufficient documentation

## 2021-03-20 DIAGNOSIS — Z21 Asymptomatic human immunodeficiency virus [HIV] infection status: Secondary | ICD-10-CM | POA: Insufficient documentation

## 2021-03-20 MED ORDER — PENICILLIN V POTASSIUM 500 MG PO TABS: 500.0000 mg | ORAL_TABLET | Freq: Four times a day (QID) | ORAL | 0 refills | Status: AC

## 2021-03-20 MED ORDER — HYDROCODONE-ACETAMINOPHEN 5-325 MG PO TABS
1.0000 | ORAL_TABLET | Freq: Once | ORAL | Status: AC
  Administered 2021-03-20: 1 via ORAL
  Filled 2021-03-20: qty 1

## 2021-03-20 MED ORDER — DICLOFENAC SODIUM 1 % EX GEL: 4.0000 g | Freq: Four times a day (QID) | CUTANEOUS | 0 refills | Status: DC | PRN

## 2021-03-20 NOTE — Discharge Instructions (Addendum)
Please read and follow all provided instructions.  You have been seen today for ankle pain & dental pain.   Tests performed today include: An x-ray of the affected area - does NOT show any broken bones or dislocations.  Vital signs. See below for your results today.   Home care instructions: -- *PRICE in the first 24-48 hours after injury: Protect (with brace, splint, sling), if given by your provider Rest Ice- Do not apply ice pack directly to your skin, place towel or similar between your skin and ice/ice pack. Apply ice for 20 min, then remove for 40 min while awake Compression- Wear brace, elastic bandage, splint as directed by your provider Elevate affected extremity above the level of your heart when not walking around for the first 24-48 hours   Medications:  Ankle:  - Diclofenac gel- apply 4 times per day as needed for pain in the ankle.  Dental:  - Penicillin VK- this is an antibiotic to treat for infection.   You make take Tylenol per over the counter dosing with these medications.   We have prescribed you new medication(s) today. Discuss the medications prescribed today with your pharmacist as they can have adverse effects and interactions with your other medicines including over the counter and prescribed medications. Seek medical evaluation if you start to experience new or abnormal symptoms after taking one of these medicines, seek care immediately if you start to experience difficulty breathing, feeling of your throat closing, facial swelling, or rash as these could be indications of a more serious allergic reaction   Follow-up instructions: Please follow-up with your primary care provider as well as a dentist, orthopedist and infectious disease. Please call to schedule closest available follow up.  Return instructions:  Please return if your digits or extremity are numb or tingling, appear gray or blue, or you have severe pain (also elevate the extremity and loosen splint  or wrap if you were given one) Please return if you have redness or fevers.  Please return if you are having trouble swallowing, trouble breathing, or pain/swelling beneath your tongue. Please return to the Emergency Department if you experience worsening symptoms.  Please return if you have any other emergent concerns. Additional Information:  Your vital signs today were: BP (!) 139/92    Pulse 90    Temp 99 F (37.2 C) (Oral)    Resp 18    SpO2 100%  If your blood pressure (BP) was elevated above 135/85 this visit, please have this repeated by your doctor within one month. ---------------
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
## 2021-03-20 NOTE — Progress Notes (Signed)
Orthopedic Tech Progress Note Patient Details:  Mitchell Romero [facility] Jul 22, 1985 [id number]  Ortho Devices Type of Ortho Device: Crutches, ASO Ortho Device/Splint Location: LLE Ortho Device/Splint Interventions: Ordered, Application, Adjustment   Post Interventions Patient Tolerated: Well Instructions Provided: Adjustment of device, Care of device, Poper ambulation with device  Mitchell Romero 03/20/2021, 11:56 PM
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
## 2021-03-21 NOTE — ED Notes (Signed)
Patient verbalizes understanding of discharge instructions. Opportunity for questioning and answers were provided. Armband removed by staff, pt discharged from ED ambulatory with crutches.

## 2021-03-23 ENCOUNTER — Encounter (HOSPITAL_COMMUNITY): Payer: Self-pay | Admitting: Emergency Medicine

## 2021-03-25 ENCOUNTER — Emergency Department (HOSPITAL_COMMUNITY): Payer: Self-pay

## 2021-03-25 ENCOUNTER — Emergency Department (HOSPITAL_COMMUNITY)
Admission: EM | Admit: 2021-03-25 | Discharge: 2021-03-26 | Disposition: A | Discharge status: Home / Self Care | Payer: Self-pay | Attending: Emergency Medicine | Admitting: Emergency Medicine

## 2021-03-25 ENCOUNTER — Encounter (HOSPITAL_COMMUNITY): Payer: Self-pay

## 2021-03-25 DIAGNOSIS — M7989 Other specified soft tissue disorders: Secondary | ICD-10-CM | POA: Insufficient documentation

## 2021-03-25 DIAGNOSIS — R0602 Shortness of breath: Secondary | ICD-10-CM | POA: Diagnosis not present

## 2021-03-25 DIAGNOSIS — R911 Solitary pulmonary nodule: Secondary | ICD-10-CM | POA: Diagnosis not present

## 2021-03-25 DIAGNOSIS — Z21 Asymptomatic human immunodeficiency virus [HIV] infection status: Secondary | ICD-10-CM | POA: Insufficient documentation

## 2021-03-25 DIAGNOSIS — Z91010 Allergy to peanuts: Secondary | ICD-10-CM | POA: Diagnosis not present

## 2021-03-25 DIAGNOSIS — F17200 Nicotine dependence, unspecified, uncomplicated: Secondary | ICD-10-CM | POA: Insufficient documentation

## 2021-03-25 DIAGNOSIS — Z91040 Latex allergy status: Secondary | ICD-10-CM | POA: Diagnosis not present

## 2021-03-25 DIAGNOSIS — R600 Localized edema: Secondary | ICD-10-CM | POA: Insufficient documentation

## 2021-03-25 DIAGNOSIS — Z5321 Procedure and treatment not carried out due to patient leaving prior to being seen by health care provider: Secondary | ICD-10-CM | POA: Insufficient documentation

## 2021-03-25 LAB — BASIC METABOLIC PANEL
Anion gap: 8 (ref 5–15)
BUN: 9 mg/dL (ref 6–20)
CO2: 24 mmol/L (ref 22–32)
Calcium: 9.1 mg/dL (ref 8.9–10.3)
Chloride: 107 mmol/L (ref 98–111)
Creatinine, Ser: 0.69 mg/dL (ref 0.61–1.24)
GFR, Estimated: >60 mL/min (ref >60)
Glucose, Bld: 99 mg/dL (ref 70–99)
Potassium: 3.7 mmol/L (ref 3.5–5.1)
Sodium: 139 mmol/L (ref 135–145)

## 2021-03-25 LAB — CBC WITH DIFFERENTIAL/PLATELET
Abs Immature Granulocytes: 0.05 10*3/uL (ref 0.00–0.07)
Basophils Absolute: 0 10*3/uL (ref 0.0–0.1)
Basophils Relative: 0 %
Eosinophils Absolute: 0.3 10*3/uL (ref 0.0–0.5)
Eosinophils Relative: 2 %
HCT: 37.7 % — ABNORMAL LOW (ref 39.0–52.0)
Hemoglobin: 13.1 g/dL (ref 13.0–17.0)
Immature Granulocytes: 0 %
Lymphocytes Relative: 21 %
Lymphs Abs: 2.9 10*3/uL (ref 0.7–4.0)
MCH: 33.9 pg (ref 26.0–34.0)
MCHC: 34.7 g/dL (ref 30.0–36.0)
MCV: 97.4 fL (ref 80.0–100.0)
Monocytes Absolute: 0.8 10*3/uL (ref 0.1–1.0)
Monocytes Relative: 6 %
Neutro Abs: 9.8 10*3/uL — ABNORMAL HIGH (ref 1.7–7.7)
Neutrophils Relative %: 71 %
Platelets: 339 10*3/uL (ref 150–400)
RBC: 3.87 MIL/uL — ABNORMAL LOW (ref 4.22–5.81)
RDW: 13.1 % (ref 11.5–15.5)
WBC: 13.7 10*3/uL — ABNORMAL HIGH (ref 4.0–10.5)
nRBC: 0 % (ref 0.0–0.2)

## 2021-03-25 LAB — TROPONIN I (HIGH SENSITIVITY): Troponin I (High Sensitivity): <2 ng/L (ref <18)

## 2021-03-25 LAB — BRAIN NATRIURETIC PEPTIDE: B Natriuretic Peptide: 13.9 pg/mL (ref 0.0–100.0)

## 2021-03-25 NOTE — ED Provider Triage Note (Signed)
Emergency Medicine Provider Triage Evaluation Note  Trevan Derrell [place]ter[place] , a 36 y.o. adult  was evaluated in triage.  Pt complains of lower extremity swelling.  Was seen in the ED few days ago for similar symptoms.  Had x-ray performed at that time.  Now having swelling to bilateral lower extremities.  No amlodipine use.  Has had some intermittent generalized chest tightness however denies overt chest pain.  No history of PE or DVT.  No history of heart failure. Hx of HIV. Male to male transgender  Review of Systems  Positive: LE swelling, chest tightness Negative: fever  Physical Exam  BP (!) 151/75 (BP Location: Left Arm)    Temp 98.3 F (36.8 C) (Oral)    Resp 16    Ht 6' (1.829 m)    Wt 102.1 kg    SpO2 99%    BMI 30.52 kg/m  Gen:   Awake, no distress   Resp:  Normal effort  MSK:   Moves extremities without difficulty, le edema bl L>R Other:    Medical Decision Making  Medically screening exam initiated at 8:04 PM.  Appropriate orders placed.  Arben Derrell Shock was informed that the remainder of the evaluation will be completed by another provider, this initial triage assessment does not replace that evaluation, and the importance of remaining in the ED until their evaluation is complete.  LE edema, chest tightness    Jheri Mitter A, PA-C 03/25/21 2007

## 2021-03-25 NOTE — ED Triage Notes (Signed)
Patient arrived with complaints of bilateral leg/feet swelling and 2 days of chest pain and shortness of breath. States "used to have fluid around my heart"

## 2021-03-26 ENCOUNTER — Encounter (HOSPITAL_COMMUNITY): Payer: Self-pay | Admitting: Oncology

## 2021-03-26 ENCOUNTER — Emergency Department (HOSPITAL_COMMUNITY): Payer: BC Managed Care – PPO

## 2021-03-26 ENCOUNTER — Emergency Department (HOSPITAL_COMMUNITY)
Admission: EM | Admit: 2021-03-26 | Discharge: 2021-03-26 | Disposition: A | Discharge status: Home / Self Care | Payer: BC Managed Care – PPO | Source: Home / Self Care | Attending: Emergency Medicine | Admitting: Emergency Medicine

## 2021-03-26 ENCOUNTER — Emergency Department (HOSPITAL_BASED_OUTPATIENT_CLINIC_OR_DEPARTMENT_OTHER): Payer: BC Managed Care – PPO

## 2021-03-26 ENCOUNTER — Other Ambulatory Visit: Payer: Self-pay

## 2021-03-26 DIAGNOSIS — R911 Solitary pulmonary nodule: Secondary | ICD-10-CM

## 2021-03-26 DIAGNOSIS — Z91010 Allergy to peanuts: Secondary | ICD-10-CM | POA: Insufficient documentation

## 2021-03-26 DIAGNOSIS — B20 Human immunodeficiency virus [HIV] disease: Secondary | ICD-10-CM

## 2021-03-26 DIAGNOSIS — Z21 Asymptomatic human immunodeficiency virus [HIV] infection status: Secondary | ICD-10-CM | POA: Insufficient documentation

## 2021-03-26 DIAGNOSIS — R600 Localized edema: Secondary | ICD-10-CM | POA: Insufficient documentation

## 2021-03-26 DIAGNOSIS — R609 Edema, unspecified: Secondary | ICD-10-CM

## 2021-03-26 DIAGNOSIS — Z91040 Latex allergy status: Secondary | ICD-10-CM | POA: Insufficient documentation

## 2021-03-26 DIAGNOSIS — R0602 Shortness of breath: Secondary | ICD-10-CM | POA: Insufficient documentation

## 2021-03-26 DIAGNOSIS — F17200 Nicotine dependence, unspecified, uncomplicated: Secondary | ICD-10-CM | POA: Insufficient documentation

## 2021-03-26 LAB — CBC WITH DIFFERENTIAL/PLATELET
Abs Immature Granulocytes: 0.04 10*3/uL (ref 0.00–0.07)
Basophils Absolute: 0 10*3/uL (ref 0.0–0.1)
Basophils Relative: 0 %
Eosinophils Absolute: 0.3 10*3/uL (ref 0.0–0.5)
Eosinophils Relative: 3 %
HCT: 39.1 % (ref 39.0–52.0)
Hemoglobin: 13.6 g/dL (ref 13.0–17.0)
Immature Granulocytes: 0 %
Lymphocytes Relative: 21 %
Lymphs Abs: 2.2 10*3/uL (ref 0.7–4.0)
MCH: 33.4 pg (ref 26.0–34.0)
MCHC: 34.8 g/dL (ref 30.0–36.0)
MCV: 96.1 fL (ref 80.0–100.0)
Monocytes Absolute: 0.7 10*3/uL (ref 0.1–1.0)
Monocytes Relative: 7 %
Neutro Abs: 7.2 10*3/uL (ref 1.7–7.7)
Neutrophils Relative %: 69 %
Platelets: 352 10*3/uL (ref 150–400)
RBC: 4.07 MIL/uL — ABNORMAL LOW (ref 4.22–5.81)
RDW: 13 % (ref 11.5–15.5)
WBC: 10.5 10*3/uL (ref 4.0–10.5)
nRBC: 0 % (ref 0.0–0.2)

## 2021-03-26 LAB — BASIC METABOLIC PANEL
Anion gap: 4 — ABNORMAL LOW (ref 5–15)
BUN: 8 mg/dL (ref 6–20)
CO2: 25 mmol/L (ref 22–32)
Calcium: 8.9 mg/dL (ref 8.9–10.3)
Chloride: 110 mmol/L (ref 98–111)
Creatinine, Ser: 0.61 mg/dL (ref 0.61–1.24)
GFR, Estimated: >60 mL/min (ref >60)
Glucose, Bld: 100 mg/dL — ABNORMAL HIGH (ref 70–99)
Potassium: 3.9 mmol/L (ref 3.5–5.1)
Sodium: 139 mmol/L (ref 135–145)

## 2021-03-26 LAB — D-DIMER, QUANTITATIVE: D-Dimer, Quant: 0.93 ug/mL-FEU — ABNORMAL HIGH (ref 0.00–0.50)

## 2021-03-26 MED ORDER — IOHEXOL 350 MG/ML SOLN
80.0000 mL | Freq: Once | INTRAVENOUS | Status: AC | PRN
  Administered 2021-03-26: 80 mL via INTRAVENOUS

## 2021-03-26 MED ORDER — ACETAMINOPHEN 325 MG PO TABS
650.0000 mg | ORAL_TABLET | Freq: Once | ORAL | Status: AC
  Administered 2021-03-26: 650 mg via ORAL
  Filled 2021-03-26: qty 2

## 2021-03-26 NOTE — Progress Notes (Signed)
BLE venous duplex has been completed.  Preliminary results messaged to Dr. Ronnald Nian.  Results can be found under chart review under CV PROC. 03/26/2021 12:58 PM Aoife Bold RVT, RDMS

## 2021-03-26 NOTE — ED Provider Notes (Signed)
Mitchell Romero   CSN: [id number] Arrival date & time: 03/26/21  0919     History  Chief Complaint  Patient presents with   Leg Swelling    Mitchell Romero is a 36 y.o. adult.  Patient with history of HIV, who presents to the ED with leg swelling, shortness of breath.  States that she was here yesterday but left without being seen due to wait.  She has noticed some leg swelling over the last week.  May be episode of shortness of breath and chest pain.  She is on estradiol.  Denies any fevers or chills.  Denies any active chest pain or shortness of breath.  Leg swelling gets better in the morning but gets worse as the day goes on.  No history of blood clots.  The history is provided by the patient.  Illness Severity:  Mild Onset quality:  Gradual Duration:  1 week Progression:  Waxing and waning Chronicity:  New Associated symptoms: shortness of breath   Associated symptoms: no abdominal pain, no chest pain, no congestion, no cough, no diarrhea, no ear pain, no fatigue, no fever, no headaches, no loss of consciousness, no myalgias, no nausea, no rash, no rhinorrhea, no sore throat, no vomiting and no wheezing       Home Medications Prior to Admission medications   Medication Sig Start Date End Date Taking? Authorizing Provider  bictegravir-emtricitabine-tenofovir AF (BIKTARVY) 50-200-25 MG TABS tablet Take by mouth.    [provider]  diclofenac Sodium (VOLTAREN) 1 % GEL Apply 4 g topically 4 (four) times daily as needed. 03/20/21   Petrucelli, Aldona Bar R, PA-C  estradiol valerate (DELESTROGEN) 20 MG/ML injection SMARTSIG:0.5 Milliliter(s) SUB-Q Once a Week 01/24/21   [provider]  JULUCA 50-25 MG tablet Take 1 tablet by mouth daily. 02/18/21   [provider]  omeprazole (PRILOSEC) 20 MG capsule Take by mouth. 04/13/19   [provider]  ondansetron (ZOFRAN-ODT) 4 MG disintegrating tablet  Take 1 tablet (4 mg total) by mouth every 8 (eight) hours as needed for nausea or vomiting. 02/08/20   Volney American, PA-C  penicillin v potassium (VEETID) 500 MG tablet Take 1 tablet (500 mg total) by mouth 4 (four) times daily. 02/08/20   Volney American, PA-C  penicillin v potassium (VEETID) 500 MG tablet Take 1 tablet (500 mg total) by mouth 4 (four) times daily for 7 days. 03/20/21 03/27/21  Petrucelli, Glynda Jaeger, PA-C  spironolactone (ALDACTONE) 100 MG tablet Take 100 mg by mouth daily. 01/24/21   [provider]  valACYclovir (VALTREX) 500 MG tablet Take by mouth.    [provider]      Allergies    Peanut-containing drug products, Food, Other, Latex, and Powder    Review of Systems   Review of Systems  Constitutional:  Negative for fatigue and fever.  HENT:  Negative for congestion, ear pain, rhinorrhea and sore throat.   Respiratory:  Positive for shortness of breath. Negative for cough and wheezing.   Cardiovascular:  Positive for leg swelling. Negative for chest pain.  Gastrointestinal:  Negative for abdominal pain, diarrhea, nausea and vomiting.  Musculoskeletal:  Negative for myalgias.  Skin:  Negative for rash.  Neurological:  Negative for loss of consciousness and headaches.   Physical Exam Updated Vital Signs BP 123/78    Pulse 80    Temp 98.1 F (36.7 C)    Resp 18    Ht 6' (1.829 m)  Wt 102.1 kg    SpO2 100%    BMI 30.52 kg/m  Physical Exam Vitals and nursing Romero reviewed.  Constitutional:      General: She is not in acute distress.    Appearance: She is well-developed. She is not ill-appearing.  HENT:     Head: Normocephalic and atraumatic.     Nose: Nose normal.     Mouth/Throat:     Mouth: Mucous membranes are moist.  Eyes:     Extraocular Movements: Extraocular movements intact.     Conjunctiva/sclera: Conjunctivae normal.     Pupils: Pupils are equal, round, and reactive to light.  Cardiovascular:     Rate and Rhythm:  Normal rate and regular rhythm.     Pulses: Normal pulses.     Heart sounds: Normal heart sounds. No murmur heard. Pulmonary:     Effort: Pulmonary effort is normal. No respiratory distress.     Breath sounds: Normal breath sounds.  Abdominal:     Palpations: Abdomen is soft.     Tenderness: There is no abdominal tenderness.  Musculoskeletal:        General: No swelling.     Cervical back: Normal range of motion and neck supple.     Right lower leg: Edema present.     Left lower leg: Edema present.     Comments: 1+ pitting edema bilaterally  Skin:    General: Skin is warm and dry.     Capillary Refill: Capillary refill takes less than 2 seconds.  Neurological:     General: No focal deficit present.     Mental Status: She is alert.  Psychiatric:        Mood and Affect: Mood normal.    ED Results / Procedures / Treatments   Labs (all labs ordered are listed, but only abnormal results are displayed) Labs Reviewed  CBC WITH DIFFERENTIAL/PLATELET - Abnormal; Notable for the following components:      Result Value   RBC 4.07 (*)    All other components within normal limits  D-DIMER, QUANTITATIVE - Abnormal; Notable for the following components:   D-Dimer, Quant 0.93 (*)    All other components within normal limits  BASIC METABOLIC PANEL - Abnormal; Notable for the following components:   Glucose, Bld 100 (*)    Anion gap 4 (*)    All other components within normal limits    EKG EKG shows sinus rhythm.  No ischemic changes.  Normal intervals.  Radiology DG Chest 2 View  Result Date: 03/25/2021 CLINICAL DATA:  Shortness of breath. EXAM: CHEST - 2 VIEW COMPARISON:  Chest x-ray 06/09/2016 FINDINGS: The heart size and mediastinal contours are within normal limits. Both lungs are clear. The visualized skeletal structures are unremarkable. IMPRESSION: No active cardiopulmonary disease. Electronically Signed   By: Ronney Asters M.D.   On: 03/25/2021 20:24   CT Angio Chest PE W  and/or Wo Contrast  Result Date: 03/26/2021 CLINICAL DATA:  Rule out pulmonary embolus. Lower extremity swelling, chest pain and shortness of breath. EXAM: CT ANGIOGRAPHY CHEST WITH CONTRAST TECHNIQUE: Multidetector CT imaging of the chest was performed using the standard protocol during bolus administration of intravenous contrast. Multiplanar CT image reconstructions and MIPs were obtained to evaluate the vascular anatomy. RADIATION DOSE REDUCTION: This exam was performed according to the departmental dose-optimization program which includes automated exposure control, adjustment of the mA and/or kV according to patient size and/or use of iterative reconstruction technique. CONTRAST:  44mL OMNIPAQUE IOHEXOL 350  MG/ML SOLN COMPARISON:  None. FINDINGS: Cardiovascular: Satisfactory opacification of the pulmonary arteries to the segmental level. No evidence of pulmonary embolism. Normal heart size. No pericardial effusion. Mediastinum/Nodes: No enlarged mediastinal, hilar, or axillary lymph nodes. Thyroid gland, trachea, and esophagus demonstrate no significant findings. Lungs/Pleura: No pleural effusion, airspace consolidation, or atelectasis. Tiny nodule in the periphery of the left upper lobe measures 4 mm, image 59/10 Upper Abdomen: No acute abnormality. Musculoskeletal: No chest wall abnormality. No acute or significant osseous findings. Gynecomastia. Review of the MIP images confirms the above findings. IMPRESSION: 1. No evidence for acute pulmonary embolism. 2. 4 mm nodule in the periphery of the left upper lobe. This is of doubtful clinical significance. No follow-up needed if patient is low-risk. Non-contrast chest CT can be considered in 12 months if patient is high-risk. This recommendation follows the consensus statement: Guidelines for Management of Incidental Pulmonary Nodules Detected on CT Images: From the Fleischner Society 2017; Radiology 2017; 284:228-243. Electronically Signed   By: Kerby Moors  M.D.   On: 03/26/2021 13:20   VAS [place] LOWER EXTREMITY VENOUS (DVT) (7a-7p)  Result Date: 03/26/2021  Lower Venous DVT Study Patient Name:  Mitchell Romero  Date of Exam:   03/26/2021 Medical Rec #: [id number]              Accession #:    [id number] Date of Birth: June 20, 1985              Patient Gender: M Patient Age:   86 years Exam Location:  [facility] Procedure:      VAS [place] LOWER EXTREMITY VENOUS (DVT) Referring Phys: Mitchell Romero --------------------------------------------------------------------------------  Indications: Edema.  Comparison Study: No previous exams Performing Technologist: Jody Hill RVT, RDMS  Examination Guidelines: A complete evaluation includes B-mode imaging, spectral Doppler, color Doppler, and power Doppler as needed of all accessible portions of each vessel. Bilateral testing is considered an integral part of a complete examination. Limited examinations for reoccurring indications may be performed as noted. The reflux portion of the exam is performed with the patient in reverse Trendelenburg.  +---------+---------------+---------+-----------+----------+-------------------+  RIGHT     Compressibility Phasicity Spontaneity Properties Thrombus Aging       +---------+---------------+---------+-----------+----------+-------------------+  CFV       Full            Yes       Yes                                         +---------+---------------+---------+-----------+----------+-------------------+  SFJ       Full                                                                  +---------+---------------+---------+-----------+----------+-------------------+  FV Prox   Full            Yes       Yes                                         +---------+---------------+---------+-----------+----------+-------------------+  FV Mid    Full  Yes       Yes                                         +---------+---------------+---------+-----------+----------+-------------------+  FV  Distal Full            Yes       Yes                                         +---------+---------------+---------+-----------+----------+-------------------+  PFV       Full                                                                  +---------+---------------+---------+-----------+----------+-------------------+  POP       Full            Yes       Yes                                         +---------+---------------+---------+-----------+----------+-------------------+  PTV       Full                                                                  +---------+---------------+---------+-----------+----------+-------------------+  PERO      Full                                             Not well visualized  +---------+---------------+---------+-----------+----------+-------------------+   +---------+---------------+---------+-----------+----------+-------------------+  LEFT      Compressibility Phasicity Spontaneity Properties Thrombus Aging       +---------+---------------+---------+-----------+----------+-------------------+  CFV       Full            Yes       Yes                                         +---------+---------------+---------+-----------+----------+-------------------+  SFJ       Full                                                                  +---------+---------------+---------+-----------+----------+-------------------+  FV Prox   Full            Yes       Yes                                         +---------+---------------+---------+-----------+----------+-------------------+  FV Mid    Full            Yes       Yes                                         +---------+---------------+---------+-----------+----------+-------------------+  FV Distal Full            Yes       Yes                                         +---------+---------------+---------+-----------+----------+-------------------+  PFV       Full                                                                   +---------+---------------+---------+-----------+----------+-------------------+  POP       Full            Yes       Yes                                         +---------+---------------+---------+-----------+----------+-------------------+  PTV       Full                                                                  +---------+---------------+---------+-----------+----------+-------------------+  PERO      Full                                             Not well visualized  +---------+---------------+---------+-----------+----------+-------------------+    Summary: BILATERAL: - No evidence of deep vein thrombosis seen in the lower extremities, bilaterally. - No evidence of superficial venous thrombosis in the lower extremities, bilaterally. -No evidence of popliteal cyst, bilaterally. Subcutaneous edema noted bilaterally to calves and ankles.  LEFT: - Ultrasound characteristics of enlarged lymph nodes noted in the groin.  *See table(s) above for measurements and observations.    Preliminary     Procedures Procedures    Medications Ordered in ED Medications  acetaminophen (TYLENOL) tablet 650 mg (has no administration in time range)  iohexol (OMNIPAQUE) 350 MG/ML injection 80 mL (80 mLs Intravenous Contrast Given 03/26/21 1303)    ED Course/ Medical Decision Making/ A&P                           Medical Decision Making Amount and/or Complexity of Data Reviewed Labs: ordered. Radiology: ordered.  Risk OTC drugs. Prescription drug management.   Mitchell Romero is here with leg swelling, shortness of breath.  Normal vitals.  No fever.  History of HIV, on estradiol.  Patient with  leg swelling for the past week.  Was seen here yesterday but left without being seen due to wait.  My review of the EKG shows sinus rhythm.  No ischemic changes.  Lab work reviewed from yesterday shows normal troponin, normal BNP, chest x-ray per my interpretation showed no evidence of volume overload or  pneumonia.  Patient with no significant anemia, electrolyte ab kidney injury.  She has had leg swelling that has improved in the morning but gets worse during the day.  Differential diagnosis is DVT versus venous stasis.  Lower suspicion for PE.  We will get ultrasound of the left lower extremity as there is increased swelling in the left greater than the right.  We will get a D-dimer to evaluate for PE.  We will recheck basic labs a CBC and BMP to evaluate for anemia.  Overall will reevaluate.  I do not suspect heart failure or acute coronary syndrome.  Patient with no significant anemia, electrolyte abnormality, kidney injury.  D-dimer is elevated and CT scan of the chest was performed that showed no pulmonary embolism.  Does have a small pulmonary nodule.  Patient does smoke.  Recommend follow-up with primary care doctor for CT scan next year.  DVT study was also negative.  Overall wear compression socks and suspect that this is venous stasis.  Could be medication side effect and will have her follow-up with primary care doctor.  Discharged in good condition.   This chart was dictated using voice recognition software.  Despite best efforts to proofread,  errors can occur which can change the documentation meaning.         Final Clinical Impression(s) / ED Diagnoses Final diagnoses:  Peripheral edema  Pulmonary nodule    Rx / DC Orders ED Discharge Orders     None         Lennice Sites, DO 03/26/21 1340

## 2021-03-26 NOTE — Discharge Instructions (Signed)
Recommend wearing compression socks for the swelling in your legs.  Follow-up with your primary care doctor to go over your medications to see if they might be playing a role in the swelling in your legs.  They may also start you on a fluid pill to help with this but recommend using compression socks first.  As we talked about incidentally we found a small pulmonary nodule in your lungs and given that you are smoker you may need a CT scan next year to reevaluate this nodule.

## 2021-03-26 NOTE — ED Triage Notes (Signed)
Pt seen yesterday d/t b/l LE swelling, CP and shob.  Pt states she waited 9 hours yesterday and could not tolerate being in the lobby any longer.  Pt reports no new sx or any change in current sx since yesterday.

## 2021-03-26 NOTE — ED Provider Triage Note (Signed)
Emergency Medicine Provider Triage Evaluation Note  Mitchell Romero , a 36 y.o. adult  was evaluated in triage.  Pt complains of lower extremity swelling, shortness of breath, chest pain.  Lower extremity swelling has been present for over a week.  Denies previous diagnosis of CHF.  Was evaluated yesterday and had blood work done but left before being roomed.   Review of Systems  Positive: As above Negative: As above  Physical Exam  BP 135/86 (BP Location: Left Arm)    Pulse 88    Temp 98.5 F (36.9 C) (Oral)    Resp 20    SpO2 100%  Gen:   Awake, no distress   Resp:  Normal effort MSK:   Moves extremities without difficulty  Other:  Bilateral lower extremity pitting edema worse on  Medical Decision Making  Medically screening exam initiated at 10:28 AM.  Appropriate orders placed.  Mitchell Romero was informed that the remainder of the evaluation will be completed by another provider, this initial triage assessment does not replace that evaluation, and the importance of remaining in the ED until their evaluation is complete.     Evlyn Courier, PA-C 03/26/21 1029

## 2021-03-30 ENCOUNTER — Ambulatory Visit: Payer: BLUE CROSS/BLUE SHIELD | Admitting: Physician Assistant

## 2021-04-28 ENCOUNTER — Encounter: Payer: Self-pay | Admitting: Physician Assistant

## 2021-04-28 ENCOUNTER — Ambulatory Visit (INDEPENDENT_AMBULATORY_CARE_PROVIDER_SITE_OTHER): Payer: Self-pay | Admitting: Physician Assistant

## 2021-04-28 ENCOUNTER — Telehealth: Payer: Self-pay

## 2021-04-28 ENCOUNTER — Ambulatory Visit (INDEPENDENT_AMBULATORY_CARE_PROVIDER_SITE_OTHER): Payer: Self-pay | Admitting: Pharmacist

## 2021-04-28 ENCOUNTER — Other Ambulatory Visit (HOSPITAL_COMMUNITY): Payer: Self-pay

## 2021-04-28 ENCOUNTER — Other Ambulatory Visit: Payer: Self-pay

## 2021-04-28 VITALS — BP 121/83 | HR 86 | Temp 97.7°F | Resp 16 | Wt 226.0 lb

## 2021-04-28 DIAGNOSIS — Z79890 Hormone replacement therapy (postmenopausal): Secondary | ICD-10-CM

## 2021-04-28 DIAGNOSIS — R609 Edema, unspecified: Secondary | ICD-10-CM

## 2021-04-28 DIAGNOSIS — F649 Gender identity disorder, unspecified: Secondary | ICD-10-CM

## 2021-04-28 DIAGNOSIS — B20 Human immunodeficiency virus [HIV] disease: Secondary | ICD-10-CM

## 2021-04-28 DIAGNOSIS — Z113 Encounter for screening for infections with a predominantly sexual mode of transmission: Secondary | ICD-10-CM | POA: Insufficient documentation

## 2021-04-28 DIAGNOSIS — R600 Localized edema: Secondary | ICD-10-CM | POA: Insufficient documentation

## 2021-04-28 MED ORDER — JULUCA 50-25 MG PO TABS: 1.0000 | ORAL_TABLET | Freq: Every day | ORAL | 5 refills | Status: DC

## 2021-04-28 MED ORDER — FUROSEMIDE 20 MG PO TABS: 20.0000 mg | ORAL_TABLET | Freq: Every day | ORAL | 0 refills | Status: DC

## 2021-04-28 MED ORDER — ESTRADIOL VALERATE 20 MG/ML IM OIL: 20.0000 mg | TOPICAL_OIL | INTRAMUSCULAR | 4 refills | Status: DC

## 2021-04-28 MED ORDER — SPIRONOLACTONE 100 MG PO TABS: 100.0000 mg | ORAL_TABLET | Freq: Every day | ORAL | 5 refills | Status: DC

## 2021-04-28 NOTE — Telephone Encounter (Signed)
[facility] Patient Advocate Encounter ? ?Insurance verification completed.   ? ?The patient is uninsured and will need patient assistance for medication. ? ?We can complete the application and will need to meet with the patient for signatures and income documentation. ? ?Insurance card on file says expired. ? ?Ileene Patrick, CPhT ?Specialty Pharmacy Patient Advocate ?[facility] ?Phone: [phone] ?Fax:  [phone]  ?

## 2021-04-28 NOTE — Patient Instructions (Addendum)
Compression hose knee high, keep legs elevated as much as possible ?Start lasix 20 mg once daily to help reduce fluid-[facility] ?Follow up in 1 month regarding swelling in legs if pain persists we will send to orthopedist ?Sent for medical records today ?Refill jeluca ?Refilled hormone replacement therapy ?Screening for sexually transmitted diseases-will Mychart with results always use condoms to prevent these diseases ? ? ? ? ?

## 2021-04-28 NOTE — Progress Notes (Signed)
? ?04/28/2021 ? ?HPI: Mitchell Romero is a 36 y.o. adult who presents to the [place] [facility] today to establish care for HIV, previously received care in [place], [place].  ? ?Patient Active Problem List  ? Diagnosis Date Noted  ? Gender dysphoria 04/28/2021  ? Screening for venereal disease (VD) 04/28/2021  ? Hormone replacement therapy (HRT) 04/28/2021  ? Peripheral edema 04/28/2021  ? Human immunodeficiency virus (HIV) disease ([place]) 03/26/2021  ? Alcohol abuse with alcohol-induced mood disorder ([place]) 02/13/2018  ? ? ?Patient's Medications  ?New Prescriptions  ? DOLUTEGRAVIR-RILPIVIRINE (JULUCA) 50-25 MG TABLET    Take 1 tablet by mouth daily before lunch.  ? ESTRADIOL VALERATE (DELESTROGEN) 20 MG/ML INJECTION    Inject 1 mL (20 mg total) into the muscle every 14 (fourteen) days.  ? SPIRONOLACTONE (ALDACTONE) 100 MG TABLET    Take 1 tablet (100 mg total) by mouth daily.  ?Previous Medications  ? BICTEGRAVIR-EMTRICITABINE-TENOFOVIR AF (BIKTARVY) 50-200-25 MG TABS TABLET    Take by mouth.  ? DICLOFENAC SODIUM (VOLTAREN) 1 % GEL    Apply 4 g topically 4 (four) times daily as needed.  ? ESTRADIOL VALERATE (DELESTROGEN) 20 MG/ML INJECTION    SMARTSIG:0.5 Milliliter(s) SUB-Q Once a Week  ? JULUCA 50-25 MG TABLET    Take 1 tablet by mouth daily.  ? OMEPRAZOLE (PRILOSEC) 20 MG CAPSULE    Take by mouth.  ? ONDANSETRON (ZOFRAN-ODT) 4 MG DISINTEGRATING TABLET    Take 1 tablet (4 mg total) by mouth every 8 (eight) hours as needed for nausea or vomiting.  ? PENICILLIN V POTASSIUM (VEETID) 500 MG TABLET    Take 1 tablet (500 mg total) by mouth 4 (four) times daily.  ? SPIRONOLACTONE (ALDACTONE) 100 MG TABLET    Take 100 mg by mouth daily.  ? VALACYCLOVIR (VALTREX) 500 MG TABLET    Take by mouth.  ?Modified Medications  ? Modified Medication Previous Medication  ? FUROSEMIDE (LASIX) 20 MG TABLET furosemide (LASIX) 20 MG tablet  ?    Take 1 tablet (20 mg total) by mouth daily.    Take 1 tablet (20 mg total) by mouth daily.   ?Discontinued Medications  ? No medications on file  ? ? ?Allergies: ?Allergies  ?Allergen Reactions  ? Peanut-Containing Drug Products Anaphylaxis  ? Food Other (See Comments)  ?  Tomatoes cause acid reflux ?Orange Juice causes vomiting  ? Other Swelling  ?  Pecans and other tree nuts  ? Latex Hives  ? Powder Rash  ?  Powder inside gloves  ? ? ?Past Medical History: ?Past Medical History:  ?Diagnosis Date  ? Asthma   ? HIV (human immunodeficiency virus infection) ([place])   ? Hypertension   ? PCP took off HTN meds  ? Pancreatic cancer ([place])   ? ? ?Social History: ?Social History  ? ?Socioeconomic History  ? Marital status: Single  ?  Spouse name: Not on file  ? Number of children: Not on file  ? Years of education: Not on file  ? Highest education level: Not on file  ?Occupational History  ? Not on file  ?Tobacco Use  ? Smoking status: Every Day  ?  Types: Cigarettes  ? Smokeless tobacco: Never  ?Vaping Use  ? Vaping Use: Never used  ?Substance and Sexual Activity  ? Alcohol use: Not Currently  ?  Comment: occasional  ? Drug use: Yes  ?  Types: Marijuana  ? Sexual activity: Not Currently  ?  Partners: Male  ?  Comment: declined condoms  ?Other Topics Concern  ? Not on file  ?Social History Narrative  ? ** Merged History Encounter **  ?    ? ?Social Determinants of Health  ? ?Financial Resource Strain: Not on file  ?Food Insecurity: Not on file  ?Transportation Needs: Not on file  ?Physical Activity: Not on file  ?Stress: Not on file  ?Social Connections: Not on file  ? ? ?Labs: ?No results found for: HIV1RNAQUANT, HIV1RNAVL, CD4TABS ? ?RPR and STI ?Lab Results  ?Component Value Date  ? LABRPR Reactive (A) 03/03/2017  ? LABRPR Non Reactive 02/05/2017  ? LABRPR Reactive (A) 11/29/2015  ? LABRPR Non Reactive 06/29/2015  ? ? ?STI Results GC CT  ?03/03/2017 Negative Negative  ?11/29/2015 Negative Negative  ?06/29/2015 Negative Negative  ?06/29/2015 Negative Negative  ? ? ?Current HIV Regimen: ?Juluca ? ?Assessment: ?Mitchell Romero  is here today to establish care with Mitchell Romero for HIV infection. Patient was previously on T[place] but was stopped due to pancreatic cancer. Patient was then transitioned to [place] and endorses tolerating this regimen well other than some weight gain (likely due to estradiol). Awaiting records from facility in [place], [place]. Will obtain baseline labs today.  ? ?Patient also presents with complaints of lower extremity edema and mood swings over the last few months. She endorses taking her Delestrogen injection 1 mL SQ 1-2x week (Rx written for 0.5 mL every week). Will adjust to appropriate dose of 1 mL every 2 weeks which may improve mood side effects and swelling. Per Mitchell Romero, sent a prescription for furosemide to help with edema. She will follow up with a provider for further services, provided patient with contact information for the [facility] if she had any questions or concerns until her appointment.  ? ?Plan: ?-Resume Juluca (sent to [facility] in [place] for mail order) ?-Obtain baseline labs ?-Change delestrogen dosing to 1 mL every 2 weeks ?-Furosemide 20 mg daily for lower extremity edema  ? ?Mitchell Romero, Pharm.D. ?PGY-1 Pharmacy Resident ?04/28/2021 12:07 PM ?  ?
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
## 2021-04-29 ENCOUNTER — Encounter: Payer: Self-pay | Admitting: Physician Assistant

## 2021-04-29 ENCOUNTER — Other Ambulatory Visit: Payer: Self-pay

## 2021-04-29 DIAGNOSIS — R609 Edema, unspecified: Secondary | ICD-10-CM

## 2021-04-29 LAB — URINALYSIS, ROUTINE W REFLEX MICROSCOPIC
Bilirubin Urine: NEGATIVE
Glucose, UA: NEGATIVE
Hgb urine dipstick: NEGATIVE
Ketones, ur: NEGATIVE
Leukocytes,Ua: NEGATIVE
Nitrite: NEGATIVE
Protein, ur: NEGATIVE
Specific Gravity, Urine: 1.023 (ref 1.001–1.035)
pH: 7 (ref 5.0–8.0)

## 2021-04-29 LAB — C. TRACHOMATIS/N. GONORRHOEAE RNA
C. trachomatis RNA, TMA: NOT DETECTED
N. gonorrhoeae RNA, TMA: NOT DETECTED

## 2021-04-29 LAB — CT/NG RNA, TMA RECTAL
Chlamydia Trachomatis RNA: NOT DETECTED
Neisseria Gonorrhoeae RNA: NOT DETECTED

## 2021-04-29 LAB — GC/CHLAMYDIA PROBE, AMP (THROAT)
Chlamydia trachomatis RNA: NOT DETECTED
Neisseria gonorrhoeae RNA: NOT DETECTED

## 2021-04-29 MED ORDER — FUROSEMIDE 20 MG PO TABS: 20.0000 mg | ORAL_TABLET | Freq: Every day | ORAL | 0 refills | Status: DC

## 2021-05-03 LAB — LIPID PANEL
Cholesterol: 105 mg/dL (ref <200)
HDL: 35 mg/dL — ABNORMAL LOW (ref >=40)
LDL Cholesterol (Calc): 53 mg/dL (calc)
Non-HDL Cholesterol (Calc): 70 mg/dL (calc) (ref <130)
Total CHOL/HDL Ratio: 3 (calc) (ref <5.0)
Triglycerides: 88 mg/dL (ref <150)

## 2021-05-03 LAB — HIV-1 RNA ULTRAQUANT REFLEX TO GENTYP+
HIV 1 RNA Quant: NOT DETECTED copies/mL
HIV-1 RNA Quant, Log: NOT DETECTED Log copies/mL

## 2021-05-03 LAB — COMPLETE METABOLIC PANEL WITH GFR
AG Ratio: 1.9 (calc) (ref 1.0–2.5)
ALT: 17 U/L (ref 9–46)
AST: 16 U/L (ref 10–40)
Albumin: 4.4 g/dL (ref 3.6–5.1)
Alkaline phosphatase (APISO): 50 U/L (ref 36–130)
BUN: 7 mg/dL (ref 7–25)
CO2: 24 mmol/L (ref 20–32)
Calcium: 9.1 mg/dL (ref 8.6–10.3)
Chloride: 108 mmol/L (ref 98–110)
Creat: 0.71 mg/dL (ref 0.60–1.26)
Globulin: 2.3 g/dL (calc) (ref 1.9–3.7)
Glucose, Bld: 84 mg/dL (ref 65–99)
Potassium: 4 mmol/L (ref 3.5–5.3)
Sodium: 140 mmol/L (ref 135–146)
Total Bilirubin: 0.3 mg/dL (ref 0.2–1.2)
Total Protein: 6.7 g/dL (ref 6.1–8.1)
eGFR: 123 mL/min/{1.73_m2} (ref >=60)

## 2021-05-03 LAB — T-HELPER CELLS (CD4) COUNT (NOT AT ARMC)
Absolute CD4: 1118 cells/uL (ref 490–1740)
CD4 T Helper %: 35 % (ref 30–61)
Total lymphocyte count: 3152 cells/uL (ref 850–3900)

## 2021-05-03 LAB — CBC WITH DIFFERENTIAL/PLATELET
Absolute Monocytes: 787 cells/uL (ref 200–950)
Basophils Absolute: 12 cells/uL (ref 0–200)
Basophils Relative: 0.1 %
Eosinophils Absolute: 283 cells/uL (ref 15–500)
Eosinophils Relative: 2.3 %
HCT: 39.8 % (ref 38.5–50.0)
Hemoglobin: 13.7 g/dL (ref 13.2–17.1)
Lymphs Abs: 2989 cells/uL (ref 850–3900)
MCH: 33 pg (ref 27.0–33.0)
MCHC: 34.4 g/dL (ref 32.0–36.0)
MCV: 95.9 fL (ref 80.0–100.0)
MPV: 10.5 fL (ref 7.5–12.5)
Monocytes Relative: 6.4 %
Neutro Abs: 8229 cells/uL — ABNORMAL HIGH (ref 1500–7800)
Neutrophils Relative %: 66.9 %
Platelets: 339 10*3/uL (ref 140–400)
RBC: 4.15 10*6/uL — ABNORMAL LOW (ref 4.20–5.80)
RDW: 12.3 % (ref 11.0–15.0)
Total Lymphocyte: 24.3 %
WBC: 12.3 10*3/uL — ABNORMAL HIGH (ref 3.8–10.8)

## 2021-05-03 LAB — PROGESTERONE: Progesterone: <0.5 ng/mL (ref <1.4)

## 2021-05-03 LAB — TESTOSTERONE, FREE: TESTOSTERONE FREE: 0.5 pg/mL — ABNORMAL LOW (ref 46.0–224.0)

## 2021-05-03 LAB — RPR: RPR Ser Ql: NONREACTIVE

## 2021-05-05 ENCOUNTER — Telehealth: Payer: Self-pay

## 2021-05-05 NOTE — Telephone Encounter (Signed)
I attempted to contact the patient to have her come in to get her Estradiol level drawn, that was not collected the day of her appointment. Patient did not answer and I was able to leave a message on a secured voicemail. Please also see lab results that were sent to patient via mychart and patient has not read the results yet.  ?Mitchell Romero Mitchell Romero ? ?

## 2021-05-06 ENCOUNTER — Other Ambulatory Visit: Payer: Self-pay

## 2021-05-06 DIAGNOSIS — Z79890 Hormone replacement therapy (postmenopausal): Secondary | ICD-10-CM

## 2021-05-06 NOTE — Telephone Encounter (Signed)
I spoke to the patient and relayed lab results to the patient. Patient verbalized understanding.  Patient also scheduled to return to our clinic to have her estradiol level checked. Patient is scheduled for 05/07/21.  ?

## 2021-05-07 ENCOUNTER — Telehealth: Payer: Self-pay

## 2021-05-07 ENCOUNTER — Other Ambulatory Visit: Payer: Self-pay

## 2021-05-07 NOTE — Telephone Encounter (Signed)
Called patient regarding missed lab appointment. Not able to reach her at this time. Voicemail is full and not accepting new messages.  ?Mitchell Romero, RMA ? ?

## 2021-05-13 ENCOUNTER — Encounter (HOSPITAL_COMMUNITY): Payer: Self-pay | Admitting: Emergency Medicine

## 2021-05-13 ENCOUNTER — Emergency Department (HOSPITAL_COMMUNITY)
Admission: EM | Admit: 2021-05-13 | Discharge: 2021-05-14 | Disposition: A | Discharge status: Home / Self Care | Payer: Self-pay | Attending: Emergency Medicine | Admitting: Emergency Medicine

## 2021-05-13 ENCOUNTER — Other Ambulatory Visit: Payer: Self-pay

## 2021-05-13 DIAGNOSIS — M25572 Pain in left ankle and joints of left foot: Secondary | ICD-10-CM | POA: Insufficient documentation

## 2021-05-13 DIAGNOSIS — Z5321 Procedure and treatment not carried out due to patient leaving prior to being seen by health care provider: Secondary | ICD-10-CM | POA: Insufficient documentation

## 2021-05-13 DIAGNOSIS — K0889 Other specified disorders of teeth and supporting structures: Secondary | ICD-10-CM | POA: Insufficient documentation

## 2021-05-13 DIAGNOSIS — M25571 Pain in right ankle and joints of right foot: Secondary | ICD-10-CM | POA: Insufficient documentation

## 2021-05-13 MED ORDER — OXYCODONE-ACETAMINOPHEN 5-325 MG PO TABS
1.0000 | ORAL_TABLET | Freq: Once | ORAL | Status: AC
  Administered 2021-05-13: 1 via ORAL
  Filled 2021-05-13: qty 1

## 2021-05-13 NOTE — ED Triage Notes (Signed)
Pt arrive to ED with c/o dental pain that is been taking [facility] for it for 4 days with no relief and  bilateral foot pain, denies any injury. ?

## 2021-05-13 NOTE — ED Provider Triage Note (Signed)
Emergency Medicine Provider Triage Evaluation Note ? ?Sahith Derrell Sigl , a 36 y.o. adult  was evaluated in triage.  Pt complains of dental pain and bilateral foot pain.  Patient states that they have been on penicillin for 4 days with no relief.  No injury to the feet.  Patient states that they are on a new fluid pill for foot swelling.  But is not helping. ? ?Review of Systems  ?Positive: Dental pain, bilateral foot pain/swelling ?Negative: Fever, chest pain, shortness of breath, difficulty swallowing ? ?Physical Exam  ?BP (!) 142/91   Pulse 86   Temp 98.8 ?F (37.1 ?C) (Oral)   Resp 16   Ht '5\' 9"'$  (1.753 m)   Wt 102.1 kg   SpO2 95%   BMI 33.23 kg/m?  ?Gen:   Awake, no distress   ?Resp:  Normal effort  ?MSK:   Moves extremities without difficulty  ?Other:   ? ?Medical Decision Making  ?Medically screening exam initiated at 8:57 PM.  Appropriate orders placed.  Rashawn Derrell Langsam was informed that the remainder of the evaluation will be completed by another provider, this initial triage assessment does not replace that evaluation, and the importance of remaining in the ED until their evaluation is complete. ? ? ?  ?Kateri Plummer, PA-C ?05/13/21 2058 ? ?

## 2021-05-14 ENCOUNTER — Encounter (HOSPITAL_COMMUNITY): Payer: Self-pay

## 2021-05-14 ENCOUNTER — Other Ambulatory Visit: Payer: Self-pay

## 2021-05-14 ENCOUNTER — Emergency Department (HOSPITAL_COMMUNITY)
Admission: EM | Admit: 2021-05-14 | Discharge: 2021-05-14 | Disposition: A | Discharge status: Home / Self Care | Payer: Self-pay | Attending: Emergency Medicine | Admitting: Emergency Medicine

## 2021-05-14 DIAGNOSIS — Z21 Asymptomatic human immunodeficiency virus [HIV] infection status: Secondary | ICD-10-CM | POA: Insufficient documentation

## 2021-05-14 DIAGNOSIS — J45909 Unspecified asthma, uncomplicated: Secondary | ICD-10-CM | POA: Insufficient documentation

## 2021-05-14 DIAGNOSIS — I10 Essential (primary) hypertension: Secondary | ICD-10-CM | POA: Insufficient documentation

## 2021-05-14 DIAGNOSIS — Z91010 Allergy to peanuts: Secondary | ICD-10-CM | POA: Insufficient documentation

## 2021-05-14 DIAGNOSIS — Z91040 Latex allergy status: Secondary | ICD-10-CM | POA: Insufficient documentation

## 2021-05-14 DIAGNOSIS — K047 Periapical abscess without sinus: Secondary | ICD-10-CM | POA: Insufficient documentation

## 2021-05-14 MED ORDER — NAPROXEN 500 MG PO TABS
500.0000 mg | ORAL_TABLET | Freq: Two times a day (BID) | ORAL | 0 refills | Status: DC | PRN
Start: 2021-05-14 — End: 2021-07-14

## 2021-05-14 MED ORDER — AMOXICILLIN-POT CLAVULANATE 875-125 MG PO TABS: 1.0000 | ORAL_TABLET | Freq: Two times a day (BID) | ORAL | 0 refills | Status: DC

## 2021-05-14 MED ORDER — HYDROCODONE-ACETAMINOPHEN 5-325 MG PO TABS
1.0000 | ORAL_TABLET | Freq: Once | ORAL | Status: AC
  Administered 2021-05-14: 1 via ORAL
  Filled 2021-05-14: qty 1

## 2021-05-14 MED ORDER — NAPROXEN 500 MG PO TABS: 500.0000 mg | ORAL_TABLET | Freq: Two times a day (BID) | ORAL | 0 refills | Status: DC | PRN

## 2021-05-14 MED ORDER — KETOROLAC TROMETHAMINE 15 MG/ML IJ SOLN
30.0000 mg | Freq: Once | INTRAMUSCULAR | Status: AC
  Administered 2021-05-14: 30 mg via INTRAMUSCULAR
  Filled 2021-05-14: qty 2

## 2021-05-14 MED ORDER — AMOXICILLIN-POT CLAVULANATE 875-125 MG PO TABS
1.0000 | ORAL_TABLET | Freq: Once | ORAL | Status: AC
  Administered 2021-05-14: 1 via ORAL
  Filled 2021-05-14: qty 1

## 2021-05-14 NOTE — ED Triage Notes (Signed)
Patient presents to ED from home with complaint of dental pain x 2 weeks. Taking PCN x 5 days but states pain has spread to left side of face and now lips feel numb.  ?

## 2021-05-14 NOTE — ED Notes (Signed)
Pt back to the charge desk, requesting to know charge RN and primary RN names. Pt provided with both. ?

## 2021-05-14 NOTE — ED Notes (Signed)
Pt left her hallway bed and is in another section of the ED stating "the cold air hurts"  This RN explained to that in order for the provider to see her she would need to come back to her stretcher. Pt expresses that she is frustrated and does not want to sit in the hallway. This RN explained to pt that it is understandable that she is upset but to not further delay pt care we have her in the hallway. This RN provided pt with heat packs and warm blankets. Pt returned to stretcher and left again to another section of the ED stating "the cold air hurts my mouth" and will not return to her stretcher. EDP and Charge RN has been made aware.  ?

## 2021-05-14 NOTE — Discharge Instructions (Addendum)
Call one of the dentists offices provided to schedule an appointment for re-evaluation and further management within the next 48 hours- we have provided our on call dentist and our dental resource guide for multiple options.  ? ?Please stop taking the previously prescribed penicillin ?We have prescribed you Augmentin which is an antibiotic to treat the infection and Naproxen which is an anti-inflammatory medicine to treat the pain.  ? ?Please take all of your antibiotics until finished. You may develop abdominal discomfort or diarrhea from the antibiotic.  You may help offset this with probiotics which you can buy at the store (ask your pharmacist if unable to find) or get probiotics in the form of eating yogurt. Do not eat or take the probiotics until 2 hours after your antibiotic. If you are unable to tolerate these side effects follow-up with your primary care provider or return to the emergency department.  ? ?If you begin to experience any blistering, rashes, swelling, or difficulty breathing seek medical care for evaluation of potentially more serious side effects.  ? ?Be sure to eat something when taking the Naproxen as it can cause stomach upset and at worst stomach bleeding. Do not take additional non steroidal anti-inflammatory medicines such as Ibuprofen, Aleve, Advil, Mobic, Diclofenac, or goodie powder while taking Naproxen. You may supplement with Tylenol.  ? ?We have prescribed you new medication(s) today. Discuss the medications prescribed today with your pharmacist as they can have adverse effects and interactions with your other medicines including over the counter and prescribed medications. Seek medical evaluation if you start to experience new or abnormal symptoms after taking one of these medicines, seek care immediately if you start to experience difficulty breathing, feeling of your throat closing, facial swelling, or rash as these could be indications of a more serious allergic  reaction ? ?If you start to experience and new or worsening symptoms return to the emergency department. If you start to experience fever, chills, neck stiffness/pain, trouble swallowing, spreading numbness, weakness, or inability to move your neck or open your mouth come back to the emergency department immediately.  ? ?

## 2021-05-14 NOTE — ED Notes (Signed)
Pt approached this EMT at front desk and was upset because she wanted to be placed into a room instead of a hallway bed. I explained to pt that she was in a hallway bed to get seen sooner vs waiting to be put into a room. Pt wanted to sit out in lobby until she could be seen in a room because sitting in the hall was to cold and it was hurting her mouth. This EMT told pt that she would be waiting longer if she did that and she was okay with it. This EMT went and spoke with charge about pts concerns and charge said if the PA would see her we could put her in a triage room to be seen. PA agreed to see her, this EMT came back out to lobby to take pt to room and pt left.  ?

## 2021-05-14 NOTE — ED Provider Notes (Signed)
?[facility] DEPT ?Provider Note ? ? ?CSN: [id number] ?Arrival date & time: 05/14/21  0144 ? ?  ? ?History ? ?Chief Complaint  ?Patient presents with  ? Dental Pain  ? ? ?Mitchell Romero is a 36 y.o. adult with a history of hypertension, asthma, and HIV who presents to the emergency department with complaints of dental pain for the past 1 week.  Patient reports left lower dental pain which has been progressively worsening, has been on penicillin for the past 5 days with continued worsening.  Pain is worse with chewing.  No alleviating factors.  Having associated left lower jaw/facial swelling and started to have some numbness/paresthesias to the left lip/lower face.  Denies fever, vomiting, dysphagia, or dyspnea. ? ?HPI ? ?  ? ?Home Medications ?Prior to Admission medications   ?Medication Sig Start Date End Date Taking? Authorizing Provider  ?diclofenac Sodium (VOLTAREN) 1 % GEL Apply 4 g topically 4 (four) times daily as needed. 03/20/21   Markella Dao R, PA-C  ?dolutegravir-rilpivirine (JULUCA) 50-25 MG tablet Take 1 tablet by mouth daily before lunch. 04/28/21   Esmond Plants, RPH-CPP  ?estradiol valerate (DELESTROGEN) 20 MG/ML injection Inject 1 mL (20 mg total) into the muscle every 14 (fourteen) days. 04/28/21   Esmond Plants, RPH-CPP  ?furosemide (LASIX) 20 MG tablet Take 1 tablet (20 mg total) by mouth daily. 04/29/21 05/29/21  Robert Bellow, PA-C  ?spironolactone (ALDACTONE) 100 MG tablet Take 1 tablet (100 mg total) by mouth daily. 04/28/21   Esmond Plants, RPH-CPP  ?valACYclovir (VALTREX) 500 MG tablet Take by mouth.    [provider]  ?   ? ?Allergies    ?Peanut-containing drug products, Food, Other, Latex, and Powder   ? ?Review of Systems   ?Review of Systems  ?Constitutional:  Negative for fever.  ?HENT:  Positive for dental problem and facial swelling. Negative for trouble swallowing.   ?Respiratory:  Negative for shortness of breath.    ?Gastrointestinal:  Negative for abdominal pain and vomiting.  ?Neurological:  Positive for numbness. Negative for syncope.  ?All other systems reviewed and are negative. ? ?Physical Exam ?Updated Vital Signs ?BP (!) 150/102 (BP Location: Right Arm)   Pulse 80   Temp 99.3 ?F (37.4 ?C) (Oral)   Resp 16   SpO2 98%  ?Physical Exam ?Vitals and nursing note reviewed.  ?Constitutional:   ?   General: She is not in acute distress. ?   Appearance: She is well-developed. She is not toxic-appearing.  ?HENT:  ?   Head: Normocephalic and atraumatic.  ?   Right Ear: Tympanic membrane is not perforated, erythematous, retracted or bulging.  ?   Left Ear: Tympanic membrane is not perforated, erythematous, retracted or bulging.  ?   Nose: Nose normal.  ?   Mouth/Throat:  ?   Pharynx: Uvula midline. No oropharyngeal exudate, posterior oropharyngeal erythema or uvula swelling.  ?   Tonsils: No tonsillar abscesses.  ? ?   Comments: Posterior oropharynx is symmetric appearing. Patient tolerating own secretions without difficulty. No trismus. No drooling. No hot potato voice. No swelling beneath the tongue, submandibular compartment is soft.  ?Eyes:  ?   General:     ?   Right eye: No discharge.     ?   Left eye: No discharge.  ?   Conjunctiva/sclera: Conjunctivae normal.  ?Musculoskeletal:  ?   Cervical back: Normal range of motion and neck supple.  ?Lymphadenopathy:  ?  Cervical: No cervical adenopathy.  ?Neurological:  ?   Mental Status: She is alert.  ?   Comments: Clear speech.  No facial droop.  Symmetric smile.  Patient reports some decrease sensation to the left lower lip and to the left mandible area.  Otherwise no focal neurologic deficits.  ?Psychiatric:     ?   Behavior: Behavior normal.     ?   Thought Content: Thought content normal.  ? ? ?ED Results / Procedures / Treatments   ?Labs ?(all labs ordered are listed, but only abnormal results are displayed) ?Labs Reviewed - No data to  display ? ?EKG ?None ? ?Radiology ?No results found. ? ?Procedures ?Procedures  ? ? ?Medications Ordered in ED ?Medications  ?ketorolac (TORADOL) 15 MG/ML injection 30 mg (has no administration in time range)  ?HYDROcodone-acetaminophen (NORCO/VICODIN) 5-325 MG per tablet 1 tablet (has no administration in time range)  ?amoxicillin-clavulanate (AUGMENTIN) 875-125 MG per tablet 1 tablet (has no administration in time range)  ? ? ?ED Course/ Medical Decision Making/ A&P ?  ?                        ?Medical Decision Making ?Risk ?Prescription drug management. ? ? ?Patient presents with dental pain. Patient is nontoxic appearing, vitals without significant abnormality- BP elevated- low suspicion for HTN emergency.  ? ?Chart/nursing note reviewed for additional hx- last creatinine WNL ? ?On exam there is no gross abscess amenable to ED incision and drainage at this time..  Exam does not seem consistent w/ Ludwig's angina or deep space infection. Suspect paresthesias/numbness are nerve irritation from infection- discussed w/ attending- in agreement. No other focal neuro deficits, no facial droop.  Will discontinue penicillin and start augmentin. Analgesics ordered in the ED. Will discharge with augmentin & naproxen.[place] patient to follow-up with dentist, dental resources were provided.  Discussed treatment plan and need for follow up as well as return precautions. Provided opportunity for questions, patient confirmed understanding and is agreeable to plan. ? ? ?Final Clinical Impression(s) / ED Diagnoses ?Final diagnoses:  ?Dental infection  ? ? ?Rx / DC Orders ?ED Discharge Orders   ? ?      Ordered  ?   05/14/21 0308  ?   05/14/21 0308  ?  amoxicillin-clavulanate (AUGMENTIN) 875-125 MG tablet  Every 12 hours       ? 05/14/21 0340  ?  naproxen (NAPROSYN) 500 MG tablet  2 times daily PRN       ? 05/14/21 0340  ? ?  ?  ? ?  ? ? ?  ?Amaryllis Dyke, PA-C ?05/14/21 9924 ? ?  ?Shanon Rosser, MD ?05/14/21 [phone] ? ?

## 2021-05-14 NOTE — ED Notes (Addendum)
Pt in the hallway, yelling and being uncooperative stating that we are refusing care. This RN explained to pt that care is not being denied but she needs to return to her stretcher. [facility] [employer] is attempting to talk with pt but pt being uncooperative and is yelling at staff.  ?

## 2021-06-01 ENCOUNTER — Ambulatory Visit: Payer: Self-pay | Admitting: Physician Assistant

## 2021-07-14 ENCOUNTER — Other Ambulatory Visit: Payer: Self-pay

## 2021-07-14 ENCOUNTER — Encounter: Payer: Self-pay | Admitting: Physician Assistant

## 2021-07-14 ENCOUNTER — Ambulatory Visit: Payer: Self-pay

## 2021-07-14 ENCOUNTER — Ambulatory Visit (INDEPENDENT_AMBULATORY_CARE_PROVIDER_SITE_OTHER): Payer: Self-pay | Admitting: Physician Assistant

## 2021-07-14 VITALS — BP 115/77 | HR 77 | Temp 97.6°F | Wt 223.0 lb

## 2021-07-14 DIAGNOSIS — B20 Human immunodeficiency virus [HIV] disease: Secondary | ICD-10-CM

## 2021-07-14 DIAGNOSIS — Z789 Other specified health status: Secondary | ICD-10-CM

## 2021-07-14 DIAGNOSIS — Z79890 Hormone replacement therapy (postmenopausal): Secondary | ICD-10-CM

## 2021-07-14 DIAGNOSIS — K047 Periapical abscess without sinus: Secondary | ICD-10-CM

## 2021-07-14 DIAGNOSIS — F649 Gender identity disorder, unspecified: Secondary | ICD-10-CM

## 2021-07-14 MED ORDER — ESTRADIOL VALERATE 20 MG/ML IM OIL: 20.0000 mg | TOPICAL_OIL | INTRAMUSCULAR | 4 refills | Status: DC

## 2021-07-14 MED ORDER — MEDROXYPROGESTERONE ACETATE 2.5 MG PO TABS: 2.5000 mg | ORAL_TABLET | Freq: Every day | ORAL | 1 refills | Status: DC

## 2021-07-14 MED ORDER — AMOXICILLIN-POT CLAVULANATE 875-125 MG PO TABS: 1.0000 | ORAL_TABLET | Freq: Two times a day (BID) | ORAL | 0 refills | Status: DC

## 2021-07-14 MED ORDER — "BD PLASTIPAK SYRINGE 21G X 1"" 3 ML MISC": 0.5000 mL | 0 refills | Status: AC

## 2021-07-14 MED ORDER — SPIRONOLACTONE 100 MG PO TABS: 100.0000 mg | ORAL_TABLET | Freq: Every day | ORAL | 5 refills | Status: DC

## 2021-07-14 NOTE — Progress Notes (Signed)
Patient interested in [place]. As she has been maintained on Juluca for years, will check [place] to ensure no rilpivirine resistance prior to starting. Will await results before completing assistance; patient aware she will need to wait for these results and approval for a few weeks.  Counseled that [place] is two separate intramuscular injections in the gluteal muscle on each side for each visit. Explained that the second injection is 30 days after the initial injection then every 2 months thereafter. Discussed the need for viral load monitoring every 2 months for the first 6 months and then periodically afterwards as their provider sees the need. Discussed the rare but significant chance of developing resistance despite compliance. Explained that showing up to injection appointments is very important and warned that if 2 appointments are missed, it will be reassessed by their provider whether they are a good candidate for injection therapy. Counseled on possible side effects associated with the injections such as injection site pain, which is usually mild to moderate in nature, injection site nodules, and injection site reactions. Asked to call the clinic or send me a mychart message if they experience any issues, such as fatigue, nausea, headache, rash, or dizziness. Advised that they can take ibuprofen or tylenol for injection site pain if needed.    Alfonse Spruce, PharmD, CPP Clinical Pharmacist Practitioner [facility]

## 2021-07-14 NOTE — Progress Notes (Signed)
Subjective:    Patient ID: Mitchell Romero, adult    DOB: 1985-07-25, 36 y.o.   MRN: [id number]  Chief Complaint  Patient presents with   Follow-up    Wants to discuss hormone therapy options      HPI:  Mitchell Romero is a 36 y.o. adult TGW with well controlled HIV-1.  She is currently being followed by [facility].  She is hopeful to have bottom surgery and has an appointment with counseling today to discuss gender dysphoria. She ran out of syringes for delestrogen injections 4 weeks ago.  She has been taking delestrogen 0.5 ml once weekly as well as spironolactone as directed.  She discontinued lasix for fluid retention and has been taking all medications as directed. Edema in both ankles with pain has resolved since last visit 04/2021. She is requesting addition of medroxyprogesterone 2.5 mg nightly.  Her goal is to have 64 D breasts and is hopeful to meet that goal. Explained the addition of provera will not have much additional benefit.   She is being seen by clinic dental group and has schedule left lower molar extraction on June 21,2023-completed treatment with Augmentin in March, but reports having gum swelling and pain in that molar currently.  Continues to take jeluca daily to manage HIV-1. She admits to missing 1-2 doses per week and would like to request consult regarding Cabanuva. Last viral load was undetectable and CD4 showed strong immune system in March 2023. Acquired old medical records today and will look for previous genotype otherwise discussed ordering genosure archive.   She is currently living in her own apartment and was homeless for a period of time.  She is working as Dealer at [employer]. Her husband was released fromjail 07/08/21 and she is currently not speaking with him.  He unfortunately cheated on her when he was released.  She continues to have condomless sexual intercourse with same man who has not had any new partners since  Mitchell Romero's last STI check in 04/2021. He is living with her. She declines STI testing today. Accepts lubricant but does not want condoms.  She continues to smoke 1 ppd occasionally. Some marijuana use and social alcohol use. Counseled extensively on need for smoking cessation and risks for thrombotic events while taking estrogen therapy.  She is going to set a stop date.         Allergies  Allergen Reactions   Peanut-Containing Drug Products Anaphylaxis   Food Other (See Comments)    Tomatoes cause acid reflux Orange Juice causes vomiting   Other Swelling    Pecans and other tree nuts   Latex Hives   Powder Rash    Powder inside gloves      Outpatient Medications Prior to Visit  Medication Sig Dispense Refill   dolutegravir-rilpivirine (JULUCA) 50-25 MG tablet Take 1 tablet by mouth daily before lunch. 30 tablet 5   valACYclovir (VALTREX) 500 MG tablet Take by mouth.     estradiol valerate (DELESTROGEN) 20 MG/ML injection Inject 1 mL (20 mg total) into the muscle every 14 (fourteen) days. 5 mL 4   spironolactone (ALDACTONE) 100 MG tablet Take 1 tablet (100 mg total) by mouth daily. 30 tablet 5   diclofenac Sodium (VOLTAREN) 1 % GEL Apply 4 g topically 4 (four) times daily as needed. (Patient not taking: Reported on 07/14/2021) 50 g 0   naproxen (NAPROSYN) 500 MG tablet Take 1 tablet (500 mg total) by mouth 2 (two) times  daily as needed for moderate pain. (Patient not taking: Reported on 07/14/2021) 15 tablet 0   amoxicillin-clavulanate (AUGMENTIN) 875-125 MG tablet Take 1 tablet by mouth every 12 (twelve) hours. (Patient not taking: Reported on 07/14/2021) 14 tablet 0   furosemide (LASIX) 20 MG tablet Take 1 tablet (20 mg total) by mouth daily. (Patient not taking: Reported on 07/14/2021) 30 tablet 0   No facility-administered medications prior to visit.     Past Medical History:  Diagnosis Date   Asthma    HIV (human immunodeficiency virus infection) ([place])    Hypertension    PCP  took off HTN meds   Pancreatic cancer ([place])      History reviewed. No pertinent surgical history.     Review of Systems  Constitutional:  Negative for chills, fatigue and fever.  HENT: Negative.    Eyes:  Negative for visual disturbance.  Respiratory: Negative.  Negative for cough, shortness of breath and wheezing.   Cardiovascular:  Negative for chest pain, palpitations and leg swelling.  Gastrointestinal:  Negative for abdominal pain, constipation, nausea and vomiting.  Musculoskeletal:  Negative for arthralgias and myalgias.  Skin: Negative.   Psychiatric/Behavioral:  Negative for agitation, behavioral problems, confusion and suicidal ideas. The patient is not nervous/anxious.      Objective:    BP 115/77   Pulse 77   Temp 97.6 F (36.4 C) (Oral)   Wt 223 lb (101.2 kg)   SpO2 96%   BMI 32.93 kg/m  Nursing note and vital signs reviewed.  Physical Exam Vitals reviewed.  Constitutional:      General: She is not in acute distress.    Appearance: Normal appearance. She is not ill-appearing, toxic-appearing or diaphoretic.  HENT:     Head: Normocephalic and atraumatic.  Eyes:     Extraocular Movements: Extraocular movements intact.     Conjunctiva/sclera: Conjunctivae normal.     Pupils: Pupils are equal, round, and reactive to light.  Cardiovascular:     Rate and Rhythm: Normal rate and regular rhythm.  Pulmonary:     Effort: Pulmonary effort is normal.  Musculoskeletal:     Cervical back: Normal range of motion.  Skin:    General: Skin is warm and dry.  Neurological:     Mental Status: She is alert.  Psychiatric:        Mood and Affect: Mood normal.        Behavior: Behavior normal.        Thought Content: Thought content normal.        Judgment: Judgment normal.        07/14/2021   10:05 AM 04/28/2021   10:18 AM  Depression screen PHQ 2/9  Decreased Interest 0 0  Down, Depressed, Hopeless 1 1  PHQ - 2 Score 1 1       Assessment & Plan:   HIV-1Discussed switching from [place] to [place], will order [employer] and review medical records received from [place]. This will improve adherence.  HRT-ordered syringes, delestrogen, provera 2.5 mg nightly, and sprionolactione. Continue to follow up with [facility] transgender providers.Cautioned about smoking in combination with Estrogen therapy.  Created a smoking cessation plan with a stop date. Counseled > 5 minutes. Discussed risks associated with estrogen and tobacco use.   Abcessed tooth: refilled Augmentin, extraction scheduled for 08/12/21.     Patient Active Problem List   Diagnosis Date Noted   Gender dysphoria 04/28/2021   Screening for venereal disease (VD) 04/28/2021   Hormone replacement therapy (  HRT) 04/28/2021   Peripheral edema 04/28/2021   Human immunodeficiency virus (HIV) disease ([place]) 03/26/2021   Alcohol abuse with alcohol-induced mood disorder ([place]) 02/13/2018     Problem List Items Addressed This Visit       Other   Human immunodeficiency virus (HIV) disease ([place])   Relevant Orders   COMPLETE METABOLIC PANEL WITH GFR   T-helper cell (CD4)- ([facility] clinic only)   HIV-1 RNA quant-no reflex-bld   Testosterone, free   Gender dysphoria   Relevant Medications   medroxyPROGESTERone (PROVERA) 2.5 MG tablet   Other Relevant Orders   Testosterone, free   Hormone replacement therapy (HRT) - Primary   Relevant Medications   SYRINGE-NEEDLE, DISP, 3 ML (BD PLASTIPAK SYRINGE) 21G X 1" 3 ML MISC   spironolactone (ALDACTONE) 100 MG tablet   estradiol valerate (DELESTROGEN) 20 MG/ML injection   medroxyPROGESTERone (PROVERA) 2.5 MG tablet   Other Relevant Orders   CBC   COMPLETE METABOLIC PANEL WITH GFR   Testosterone, free   Other Visit Diagnoses     Tooth abscess       Relevant Medications   amoxicillin-clavulanate (AUGMENTIN) 875-125 MG tablet        I have discontinued Cheng D. Mandrell "Mitchell Romero"'s furosemide. I am also having her start on BD Plastipak  Syringe and medroxyPROGESTERone. Additionally, I am having her maintain her valACYclovir, diclofenac Sodium, Juluca, naproxen, spironolactone, estradiol valerate, and amoxicillin-clavulanate.   Meds ordered this encounter  Medications   SYRINGE-NEEDLE, DISP, 3 ML (BD PLASTIPAK SYRINGE) 21G X 1" 3 ML MISC    Sig: 0.5 mLs by Does not apply route once a week for 12 doses.    Dispense:  3 each    Refill:  0    Order Specific Question:   Supervising Provider    Answer:   VAN DAM, CORNELIUS N [[id number]]   spironolactone (ALDACTONE) 100 MG tablet    Sig: Take 1 tablet (100 mg total) by mouth daily.    Dispense:  30 tablet    Refill:  5    Order Specific Question:   Supervising Provider    Answer:   VAN DAM, CORNELIUS N [[id number]]   estradiol valerate (DELESTROGEN) 20 MG/ML injection    Sig: Inject 1 mL (20 mg total) into the muscle every 14 (fourteen) days.    Dispense:  5 mL    Refill:  4    Order Specific Question:   Supervising Provider    Answer:   VAN DAM, CORNELIUS N [[id number]]   amoxicillin-clavulanate (AUGMENTIN) 875-125 MG tablet    Sig: Take 1 tablet by mouth every 12 (twelve) hours.    Dispense:  14 tablet    Refill:  0    Order Specific Question:   Supervising Provider    Answer:   VAN DAM, CORNELIUS N [[id number]]   medroxyPROGESTERone (PROVERA) 2.5 MG tablet    Sig: Take 1 tablet (2.5 mg total) by mouth daily.    Dispense:  30 tablet    Refill:  1    Order Specific Question:   Supervising Provider    Answer:   Tommy Medal, CORNELIUS N [[id number]]     Follow-up: Return in about 4 months (around 11/14/2021) for HIV follow up in 4 months and hormone replacement therapy follow up in 2 months.

## 2021-07-14 NOTE — Patient Instructions (Addendum)
Continue delestrogen 05 mg once weekly Refilled spironolactone and delestrogen Augmentin refilled for tooth abscess, extraction schedule August 12 2021 Continue jeluca as directed Managed by [facility] discussing top surgery Labs today  Counseling appointment today  Provided smoking cessation counseling, discuss increased risk for clots while taking estrogen Medroxyprogesterone is not covered by [facility] will send to [facility] w good rx card, start porvera 2.5 mg once daily Needs covid card for work Lubricant provided today Syringes supplied Discussed Cabanuva switch from [place] with pharmacist Alfonse Spruce

## 2021-07-15 DIAGNOSIS — Z789 Other specified health status: Secondary | ICD-10-CM | POA: Insufficient documentation

## 2021-07-15 LAB — T-HELPER CELL (CD4) - (RCID CLINIC ONLY)
CD4 % Helper T Cell: 37 % (ref 33–65)
CD4 T Cell Abs: 823 /uL (ref 400–1790)

## 2021-07-21 ENCOUNTER — Telehealth: Payer: Self-pay

## 2021-07-21 NOTE — Telephone Encounter (Signed)
Received the following message from Silvio Pate, PA:  "Mitchell Romero did not view her test results please notify her that her estradiol is in upper range, despite not using delestrogen for 1 month.  An increase in estrogen is not recommended at this time. "    Called patient, relayed that estrogen levels are in upper range and no need to increase estradiol dose at this time. Patient verbalized understanding and has no further questions.   Beryle Flock, RN

## 2021-07-21 NOTE — Progress Notes (Unsigned)
Therapist met with client as scheduled to continue rapport building. Therapist introduced and shared some information about herself and encouraged the same from client in order to build trust and openness within the counseling relationship. Therapist discussed and reviewed treatment plans with client based on the result from client's assessment. Therapist discussed client current mental health symptoms /diagnosis of DSM-5, as well as recommendations and target population for various eligibility services. Therapist assessed for SI/HI during session and will follow-up with client during the next session.  Client attended session with therapist as scheduled and presented alert; orient X4. On start of session, client affect appeared euthymic; affect was congruent with client report. Appearance was relaxed/casual, and attitude was appropriate. Client was receptive to rapport building by sharing information about him/herself, such as: expressing likes, dislikes, and strengths. Client was able to make connections between consequences, challenges and alternative thoughts of mental health recovery. Client progress toward therapeutic goal(s) are minimal at this time. Client denied SI/HI.

## 2021-07-23 LAB — COMPLETE METABOLIC PANEL WITH GFR
AG Ratio: 1.8 (calc) (ref 1.0–2.5)
ALT: 13 U/L (ref 9–46)
AST: 13 U/L (ref 10–40)
Albumin: 4.2 g/dL (ref 3.6–5.1)
Alkaline phosphatase (APISO): 40 U/L (ref 36–130)
BUN: 8 mg/dL (ref 7–25)
CO2: 25 mmol/L (ref 20–32)
Calcium: 9.1 mg/dL (ref 8.6–10.3)
Chloride: 107 mmol/L (ref 98–110)
Creat: 0.7 mg/dL (ref 0.60–1.26)
Globulin: 2.4 g/dL (calc) (ref 1.9–3.7)
Glucose, Bld: 94 mg/dL (ref 65–99)
Potassium: 4.3 mmol/L (ref 3.5–5.3)
Sodium: 139 mmol/L (ref 135–146)
Total Bilirubin: 0.2 mg/dL (ref 0.2–1.2)
Total Protein: 6.6 g/dL (ref 6.1–8.1)
eGFR: 124 mL/min/{1.73_m2} (ref >=60)

## 2021-07-23 LAB — HIV-1 RNA QUANT-NO REFLEX-BLD
HIV 1 RNA Quant: <20 copies/mL — AB
HIV-1 RNA Quant, Log: <1.3 Log copies/mL — AB

## 2021-07-23 LAB — CBC
HCT: 41.4 % (ref 38.5–50.0)
Hemoglobin: 14.6 g/dL (ref 13.2–17.1)
MCH: 33.4 pg — ABNORMAL HIGH (ref 27.0–33.0)
MCHC: 35.3 g/dL (ref 32.0–36.0)
MCV: 94.7 fL (ref 80.0–100.0)
MPV: 10.5 fL (ref 7.5–12.5)
Platelets: 342 10*3/uL (ref 140–400)
RBC: 4.37 10*6/uL (ref 4.20–5.80)
RDW: 12.6 % (ref 11.0–15.0)
WBC: 11.9 10*3/uL — ABNORMAL HIGH (ref 3.8–10.8)

## 2021-07-23 LAB — ESTRADIOL: Estradiol: 241 pg/mL — ABNORMAL HIGH (ref <=39)

## 2021-07-23 LAB — TESTOSTERONE, FREE: TESTOSTERONE FREE: 0.4 pg/mL — ABNORMAL LOW (ref 46.0–224.0)

## 2021-07-28 ENCOUNTER — Ambulatory Visit: Payer: Self-pay

## 2021-09-04 ENCOUNTER — Ambulatory Visit (HOSPITAL_COMMUNITY)
Admission: EM | Admit: 2021-09-04 | Discharge: 2021-09-04 | Disposition: A | Discharge status: Home / Self Care | Payer: Self-pay | Attending: Physician Assistant | Admitting: Physician Assistant

## 2021-09-04 ENCOUNTER — Encounter (HOSPITAL_COMMUNITY): Payer: Self-pay

## 2021-09-04 DIAGNOSIS — R1010 Upper abdominal pain, unspecified: Secondary | ICD-10-CM | POA: Diagnosis present

## 2021-09-04 DIAGNOSIS — K529 Noninfective gastroenteritis and colitis, unspecified: Secondary | ICD-10-CM | POA: Diagnosis present

## 2021-09-04 DIAGNOSIS — Z8619 Personal history of other infectious and parasitic diseases: Secondary | ICD-10-CM

## 2021-09-04 LAB — CBC WITH DIFFERENTIAL/PLATELET
Abs Immature Granulocytes: 0.04 10*3/uL (ref 0.00–0.07)
Basophils Absolute: 0 10*3/uL (ref 0.0–0.1)
Basophils Relative: 0 %
Eosinophils Absolute: 0.3 10*3/uL (ref 0.0–0.5)
Eosinophils Relative: 2 %
HCT: 42.2 % (ref 39.0–52.0)
Hemoglobin: 14.8 g/dL (ref 13.0–17.0)
Immature Granulocytes: 0 %
Lymphocytes Relative: 20 %
Lymphs Abs: 2.6 10*3/uL (ref 0.7–4.0)
MCH: 33 pg (ref 26.0–34.0)
MCHC: 35.1 g/dL (ref 30.0–36.0)
MCV: 94.2 fL (ref 80.0–100.0)
Monocytes Absolute: 0.9 10*3/uL (ref 0.1–1.0)
Monocytes Relative: 7 %
Neutro Abs: 9.2 10*3/uL — ABNORMAL HIGH (ref 1.7–7.7)
Neutrophils Relative %: 71 %
Platelets: 310 10*3/uL (ref 150–400)
RBC: 4.48 MIL/uL (ref 4.22–5.81)
RDW: 12.3 % (ref 11.5–15.5)
WBC: 13 10*3/uL — ABNORMAL HIGH (ref 4.0–10.5)
nRBC: 0 % (ref 0.0–0.2)

## 2021-09-04 LAB — COMPREHENSIVE METABOLIC PANEL
ALT: 20 U/L (ref 0–44)
AST: 19 U/L (ref 15–41)
Albumin: 4.1 g/dL (ref 3.5–5.0)
Alkaline Phosphatase: 53 U/L (ref 38–126)
Anion gap: 8 (ref 5–15)
BUN: 8 mg/dL (ref 6–20)
CO2: 25 mmol/L (ref 22–32)
Calcium: 9.2 mg/dL (ref 8.9–10.3)
Chloride: 105 mmol/L (ref 98–111)
Creatinine, Ser: 0.83 mg/dL (ref 0.61–1.24)
GFR, Estimated: >60 mL/min (ref >60)
Glucose, Bld: 78 mg/dL (ref 70–99)
Potassium: 4 mmol/L (ref 3.5–5.1)
Sodium: 138 mmol/L (ref 135–145)
Total Bilirubin: 0.4 mg/dL (ref 0.3–1.2)
Total Protein: 7.1 g/dL (ref 6.5–8.1)

## 2021-09-04 LAB — LIPASE, BLOOD: Lipase: 32 U/L (ref 11–51)

## 2021-09-04 MED ORDER — VALACYCLOVIR HCL 500 MG PO TABS: 500.0000 mg | ORAL_TABLET | Freq: Every day | ORAL | 0 refills | Status: DC

## 2021-09-04 MED ORDER — ONDANSETRON 4 MG PO TBDP
4.0000 mg | ORAL_TABLET | Freq: Once | ORAL | Status: AC
  Administered 2021-09-04: 4 mg via ORAL

## 2021-09-04 MED ORDER — ONDANSETRON 4 MG PO TBDP
ORAL_TABLET | ORAL | Status: AC
  Filled 2021-09-04: qty 1

## 2021-09-04 MED ORDER — ONDANSETRON 4 MG PO TBDP: 4.0000 mg | ORAL_TABLET | Freq: Three times a day (TID) | ORAL | 0 refills | Status: AC | PRN

## 2021-09-04 NOTE — Discharge Instructions (Signed)
I am concerned that you have a stomach bug.  Please use Zofran on a scheduled basis for the next several days.  Eat a bland diet and avoid spicy/acidic/fatty foods.  Avoid alcohol and NSAIDs (aspirin, ibuprofen/Advil, naproxen/Aleve.  If your symptoms are not improving please follow-up with GI specialist.  If you have any worsening symptoms including blood in your vomit, blood in your stool, nausea/vomiting interfere with oral intake, severe abdominal pain, fever you need to be seen immediately.  I did refill your Valtrex as requested.

## 2021-09-04 NOTE — ED Provider Notes (Signed)
[facility]    CSN: [id number] Arrival date & time: 09/04/21  1219      History   Chief Complaint Chief Complaint  Patient presents with   Abdominal Pain   Emesis    HPI Mitchell Romero is a 36 y.o. adult.   Patient presents today with a several day history of intermittent abdominal pain.  This is worsened over the past 24 hours and patient has also developed nausea/vomiting and diarrhea.  Reports 4 bowel movements in the past 24 hours that were loose.  Denies any blood or mucus.  Also reports several episodes of emesis without hematemesis.  Abdominal pain is rated 5/6 on a 0-10 pain scale, localized to upper abdomen, described as aching, no aggravating or alleviating factors identified.  Denies any suspicious food intake, known sick contacts, recent antibiotic use, medication changes, recent travel.  Denies history of gastrointestinal disorder.  Denies regular alcohol use or NSAID use.  Has not tried any over-the-counter medication for symptom management.  Reports a previous history of pancreatic cancer but this has since resolved and patient is not currently receiving treatment.  Does not take GLP-1 antagonist.  Patient is requesting a refill of Valtrex to have on hand.    Past Medical History:  Diagnosis Date   Asthma    HIV (human immunodeficiency virus infection) ([place])    Hypertension    PCP took off HTN meds   Pancreatic cancer [facility])     Patient Active Problem List   Diagnosis Date Noted   Male-to-male transgender person 07/15/2021   Gender dysphoria 04/28/2021   Screening for venereal disease (VD) 04/28/2021   Hormone replacement therapy (HRT) 04/28/2021   Peripheral edema 04/28/2021   Human immunodeficiency virus (HIV) disease ([place]) 03/26/2021   Alcohol abuse with alcohol-induced mood disorder ([place]) 02/13/2018    History reviewed. No pertinent surgical history.     Home Medications    Prior to Admission medications   Medication Sig  Start Date End Date Taking? Authorizing Provider  ondansetron (ZOFRAN-ODT) 4 MG disintegrating tablet Take 1 tablet (4 mg total) by mouth every 8 (eight) hours as needed for nausea or vomiting. 09/04/21  Yes Aroura Vasudevan K, PA-C  dolutegravir-rilpivirine (JULUCA) 50-25 MG tablet Take 1 tablet by mouth daily before lunch. 04/28/21   Esmond Plants, RPH-CPP  estradiol valerate (DELESTROGEN) 20 MG/ML injection Inject 1 mL (20 mg total) into the muscle every 14 (fourteen) days. 07/14/21   Robert Bellow, PA-C  medroxyPROGESTERone (PROVERA) 2.5 MG tablet Take 1 tablet (2.5 mg total) by mouth daily. 07/14/21   Robert Bellow, PA-C  spironolactone (ALDACTONE) 100 MG tablet Take 1 tablet (100 mg total) by mouth daily. 07/14/21   Silvio Pate R, PA-C  SYRINGE-NEEDLE, DISP, 3 ML (BD PLASTIPAK SYRINGE) 21G X 1" 3 ML MISC 0.5 mLs by Does not apply route once a week for 12 doses. 07/14/21 09/30/21  Robert Bellow, PA-C  valACYclovir (VALTREX) 500 MG tablet Take 1 tablet (500 mg total) by mouth daily. 09/04/21   Chau Sawin, Derry Skill, PA-C    Family History Family History  Problem Relation Age of Onset   Heart failure Mother    Stroke Mother    Diabetes Mother    Sickle cell trait Mother    Diabetes Father    Heart failure Father    Stroke Father     Social History Social History   Tobacco Use   Smoking status: Every Day    Packs/day: 1.00  Types: Cigarettes   Smokeless tobacco: Never  Vaping Use   Vaping Use: Never used  Substance Use Topics   Alcohol use: Not Currently    Comment: occasional   Drug use: Yes    Types: Marijuana     Allergies   Peanut-containing drug products, Food, Other, Latex, and Powder   Review of Systems Review of Systems  Constitutional:  Positive for activity change and appetite change. Negative for fatigue and fever.  Respiratory:  Negative for cough and shortness of breath.   Cardiovascular:  Negative for chest pain.  Gastrointestinal:  Positive for  abdominal pain, diarrhea, nausea and vomiting. Negative for blood in stool and constipation.  Genitourinary:  Negative for dysuria, frequency, genital sores and urgency.  Musculoskeletal:  Negative for arthralgias and myalgias.  Neurological:  Negative for dizziness, light-headedness and headaches.     Physical Exam Triage Vital Signs ED Triage Vitals [09/04/21 1313]  Enc Vitals Group     BP 117/77     Pulse Rate 99     Resp 18     Temp 98.5 F (36.9 C)     Temp Source Oral     SpO2 97 %     Weight      Height      Head Circumference      Peak Flow      Pain Score 7     Pain Loc      Pain Edu?      Excl. in [place]?    No data found.  Updated Vital Signs BP 117/77 (BP Location: Left Arm)   Pulse 99   Temp 98.5 F (36.9 C) (Oral)   Resp 18   SpO2 97%   Visual Acuity Right Eye Distance:   Left Eye Distance:   Bilateral Distance:    Right Eye Near:   Left Eye Near:    Bilateral Near:     Physical Exam Vitals reviewed.  Constitutional:      General: She is awake.     Appearance: Normal appearance. She is well-developed. She is not ill-appearing.     Comments: Very pleasant patient appears stated age in no acute distress sitting comfortably on exam room table  HENT:     Head: Normocephalic and atraumatic.     Mouth/Throat:     Pharynx: Uvula midline. No oropharyngeal exudate, posterior oropharyngeal erythema or uvula swelling.  Cardiovascular:     Rate and Rhythm: Normal rate and regular rhythm.     Heart sounds: Normal heart sounds, S1 normal and S2 normal. No murmur heard. Pulmonary:     Effort: Pulmonary effort is normal.     Breath sounds: Normal breath sounds. No stridor. No wheezing, rhonchi or rales.     Comments: Clear to auscultation bilaterally Abdominal:     General: Bowel sounds are normal.     Palpations: Abdomen is soft.     Tenderness: There is abdominal tenderness in the epigastric area. There is no right CVA tenderness, left CVA tenderness,  guarding or rebound.     Comments: Mild tenderness palpation throughout epigastrium.  No evidence of acute abdomen on physical exam.  Neurological:     Mental Status: She is alert.  Psychiatric:        Behavior: Behavior is cooperative.      UC Treatments / Results  Labs (all labs ordered are listed, but only abnormal results are displayed) Labs Reviewed  CBC WITH DIFFERENTIAL/PLATELET  COMPREHENSIVE METABOLIC PANEL  LIPASE, BLOOD  EKG   Radiology No results found.  Procedures Procedures (including critical care time)  Medications Ordered in UC Medications  ondansetron (ZOFRAN-ODT) disintegrating tablet 4 mg (4 mg Oral Given 09/04/21 1401)    Initial Impression / Assessment and Plan / UC Course  I have reviewed the triage vital signs and the nursing notes.  Pertinent labs & imaging results that were available during my care of the patient were reviewed by me and considered in my medical decision making (see chart for details).     Patient is well-appearing, afebrile, nontoxic, nontachycardic.  Vital signs and physical exam reassuring today; no indication for emergent evaluation or imaging.  Patient was given Zofran in clinic with improvement of symptoms.  Was sent home with a prescription for Zofran with instruction to use this on a scheduled basis for the next several days and then decrease to be used as needed thereafter.  Recommended plenty of fluid and a bland diet.  Discussed that if symptoms are improving should follow-up with a GI specialist.  He was given contact information for local provider with instruction to call to schedule an appointment.  Recommended avoiding NSAIDs and alcohol.  CBC, CMP, lipase obtained today-results pending.  Discussed that if patient has any worsening symptoms including abdominal pain, nausea/vomiting, hematemesis, melena, hematochezia needs to be seen immediately.  Strict return precautions given.  Work excuse note provided.  Refill of  Valtrex sent to pharmacy per request.  Final Clinical Impressions(s) / UC Diagnoses   Final diagnoses:  Gastroenteritis  Upper abdominal pain  History of herpes simplex infection     Discharge Instructions      I am concerned that you have a stomach bug.  Please use Zofran on a scheduled basis for the next several days.  Eat a bland diet and avoid spicy/acidic/fatty foods.  Avoid alcohol and NSAIDs (aspirin, ibuprofen/Advil, naproxen/Aleve.  If your symptoms are not improving please follow-up with GI specialist.  If you have any worsening symptoms including blood in your vomit, blood in your stool, nausea/vomiting interfere with oral intake, severe abdominal pain, fever you need to be seen immediately.  I did refill your Valtrex as requested.     ED Prescriptions     Medication Sig Dispense Auth. Provider   valACYclovir (VALTREX) 500 MG tablet Take 1 tablet (500 mg total) by mouth daily. 30 tablet Memorie Yokoyama K, PA-C   ondansetron (ZOFRAN-ODT) 4 MG disintegrating tablet Take 1 tablet (4 mg total) by mouth every 8 (eight) hours as needed for nausea or vomiting. 20 tablet Lakshmi Sundeen, Derry Skill, PA-C      PDMP not reviewed this encounter.   Terrilee Croak, PA-C 09/04/21 1425

## 2021-09-04 NOTE — ED Triage Notes (Signed)
Pt c/o center abdominal pain radiating around to back x1wk. C/o vomiting x2 and diarrhea this am.

## 2021-09-14 ENCOUNTER — Ambulatory Visit: Payer: Self-pay | Admitting: Physician Assistant

## 2021-10-13 ENCOUNTER — Ambulatory Visit: Payer: Self-pay | Admitting: Physician Assistant

## 2021-10-13 ENCOUNTER — Ambulatory Visit: Payer: Self-pay | Admitting: Infectious Diseases

## 2021-11-03 ENCOUNTER — Ambulatory Visit: Payer: Self-pay

## 2021-11-06 ENCOUNTER — Other Ambulatory Visit: Payer: Self-pay

## 2021-11-06 ENCOUNTER — Ambulatory Visit (INDEPENDENT_AMBULATORY_CARE_PROVIDER_SITE_OTHER): Payer: Self-pay | Admitting: Physician Assistant

## 2021-11-06 ENCOUNTER — Other Ambulatory Visit (HOSPITAL_COMMUNITY): Payer: Self-pay

## 2021-11-06 ENCOUNTER — Encounter: Payer: Self-pay | Admitting: Physician Assistant

## 2021-11-06 VITALS — BP 115/51 | HR 51 | Temp 98.0°F | Wt 219.0 lb

## 2021-11-06 DIAGNOSIS — F649 Gender identity disorder, unspecified: Secondary | ICD-10-CM

## 2021-11-06 DIAGNOSIS — Z113 Encounter for screening for infections with a predominantly sexual mode of transmission: Secondary | ICD-10-CM

## 2021-11-06 DIAGNOSIS — K644 Residual hemorrhoidal skin tags: Secondary | ICD-10-CM

## 2021-11-06 DIAGNOSIS — Z79890 Hormone replacement therapy (postmenopausal): Secondary | ICD-10-CM

## 2021-11-06 DIAGNOSIS — Z789 Other specified health status: Secondary | ICD-10-CM

## 2021-11-06 DIAGNOSIS — B20 Human immunodeficiency virus [HIV] disease: Secondary | ICD-10-CM

## 2021-11-06 MED ORDER — SPIRONOLACTONE 100 MG PO TABS: 100.0000 mg | ORAL_TABLET | Freq: Every day | ORAL | 5 refills | Status: AC

## 2021-11-06 MED ORDER — JULUCA 50-25 MG PO TABS: 1.0000 | ORAL_TABLET | Freq: Every day | ORAL | 5 refills | Status: AC

## 2021-11-06 MED ORDER — VALACYCLOVIR HCL 500 MG PO TABS: 500.0000 mg | ORAL_TABLET | Freq: Every day | ORAL | 0 refills | Status: DC

## 2021-11-06 MED ORDER — MEDROXYPROGESTERONE ACETATE 2.5 MG PO TABS
2.5000 mg | ORAL_TABLET | Freq: Every day | ORAL | 1 refills | Status: AC
  Filled 2021-11-06: qty 30, 30d supply, fill #0

## 2021-11-06 MED ORDER — ESTRADIOL VALERATE 20 MG/ML IM OIL: 20.0000 mg | TOPICAL_OIL | INTRAMUSCULAR | 4 refills | Status: AC

## 2021-11-06 NOTE — Progress Notes (Signed)
Subjective:    Patient ID: Mitchell Romero, adult    DOB: April 12, 1985, 36 y.o.   MRN: [id number]  Chief Complaint  Patient presents with   Follow-up     HPI:  Mitchell Romero is a 36 y.o. adult TGW who is HIV-1 positive managed with Jeluca.  She has been well conrolled last visit was 07/14/21 CD4 823 VL undetectable.  She has been adherent to regimen, however does reports some nausea.  Interested in [place], discussed concern of compliance with visits. [place] did not register a result.   She has been sexually active, versatile, but mostly insertive anal sex, 100% condoms apart from one breaking with anonymous partner last week.  She would like full STI check. She has had 2 new partners since last visit, one is same partner and one is anonymous. Rectal pain and burning initially after insertive anal sex last week, resolved with acyclovir. Pain with Bms, she often strains and feels a "bulge" externally.  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   Feminizing hormone therapy consists of delestrogen, provera 2.5 mg nightly, spironolactone.  She is taking delestrogen 0.5 ml q week.  She continues to be concerned that her breasts are not the desired size. She has not attended counseling yet for body dysmorphia.  Intereste din bottom surgery in [place].   Continues to smoke occasionally. Discussed risks associated  with ongoing estrogen and smoking.   Allergies  Allergen Reactions   Peanut-Containing Drug Products Anaphylaxis   Food Other (See Comments)    Tomatoes cause acid reflux Orange Juice causes vomiting   Other Swelling    Pecans and other tree nuts   Latex Hives   Powder Rash    Powder inside gloves      Outpatient Medications Prior to Visit  Medication Sig Dispense Refill   ondansetron (ZOFRAN-ODT) 4 MG disintegrating tablet Take 1 tablet  (4 mg total) by mouth every 8 (eight) hours as needed for nausea or vomiting. 20 tablet 0   dolutegravir-rilpivirine (JULUCA) 50-25 MG tablet Take 1 tablet by mouth daily before lunch. 30 tablet 5   estradiol valerate (DELESTROGEN) 20 MG/ML injection Inject 1 mL (20 mg total) into the muscle every 14 (fourteen) days. 5 mL 4   medroxyPROGESTERone (PROVERA) 2.5 MG tablet Take 1 tablet (2.5 mg total) by mouth daily. 30 tablet 1   spironolactone (ALDACTONE) 100 MG tablet Take 1 tablet (100 mg total) by mouth daily. 30 tablet 5   valACYclovir (VALTREX) 500 MG tablet Take 1 tablet (500 mg total) by mouth daily. 30 tablet 0   No facility-administered medications prior to visit.     Past Medical History:  Diagnosis Date   Asthma    HIV (human immunodeficiency virus infection) ([place])    Hypertension    PCP took off HTN meds   Pancreatic cancer ([place])      No past surgical history on file.     Review of Systems  Constitutional:  Negative for appetite change, chills and fatigue.  HENT: Negative.    Respiratory:  Negative for cough, chest tightness and wheezing.   Cardiovascular:  Negative for chest pain and leg swelling.  Gastrointestinal:  Positive for nausea and rectal pain. Negative for abdominal pain and vomiting.  Genitourinary:  Negative for dysuria, genital sores, penile discharge, penile pain, penile swelling, scrotal swelling and testicular pain.  Neurological:  Negative for weakness, light-headedness and headaches.  Hematological:  Negative for adenopathy.  Psychiatric/Behavioral: Negative.        Objective:    BP (!) 115/51   Pulse (!) 51   Temp 98 F (36.7 C) (Temporal)   Wt 219 lb (99.3 kg)   BMI 32.34 kg/m  Nursing note and vital signs reviewed.  Physical Exam Vitals reviewed.  Constitutional:      Appearance: Normal appearance.  HENT:     Head: Normocephalic and atraumatic.     Nose: Nose normal.     Mouth/Throat:     Mouth: Mucous membranes are dry.      Pharynx: Oropharynx is clear.  Eyes:     Extraocular Movements: Extraocular movements intact.     Pupils: Pupils are equal, round, and reactive to light.  Cardiovascular:     Rate and Rhythm: Normal rate and regular rhythm.     Pulses: Normal pulses.     Heart sounds: Normal heart sounds.  Pulmonary:     Effort: Pulmonary effort is normal.     Breath sounds: Normal breath sounds.  Genitourinary:    Comments: External hemorrhoid at 9 oclock position, nttp, no erythema or earmth, no d/c, no lesions Musculoskeletal:     Cervical back: Normal range of motion and neck supple.  Skin:    General: Skin is warm and dry.     Findings: No lesion or rash.  Neurological:     General: No focal deficit present.     Mental Status: She is alert and oriented to person, place, and time.  Psychiatric:        Mood and Affect: Mood normal.        Behavior: Behavior normal.        Thought Content: Thought content normal.        Judgment: Judgment normal.         11/06/2021   11:17 AM 07/14/2021   10:05 AM 04/28/2021   10:18 AM  Depression screen PHQ 2/9  Decreased Interest 0 0 0  Down, Depressed, Hopeless '1 1 1  '$ PHQ - 2 Score '1 1 1       '$ Assessment & Plan:  HIV-1 : well controlled, continue Jeluca, consider switch to Dovata concern for ongoing nausea, aherent to regimen, VL and CD4 perfect 5/23-renewed HMAP  STI screening: GC/chlamydia all 3 sites, condoms always, RPR as well-refilled acyclovir  External hemorrhoid: non thrombosed, discussed warm sitx bath, Miralax, increase water intake and fiber  Feminizing hormone therapy: Delestrogen refilled, adolactone and medroxy provera, labs: estradiol and testosterone, discussed need for smoking cessation and risks with concomittant tobacco and estrogen.  Patient Active Problem List   Diagnosis Date Noted   External hemorrhoid 11/06/2021   Male-to-male transgender person 07/15/2021   Gender dysphoria 04/28/2021   Screening for STDs (sexually  transmitted diseases) 04/28/2021   Hormone replacement therapy (HRT) 04/28/2021   Peripheral edema 04/28/2021   Human immunodeficiency virus (HIV) disease ([place]) 03/26/2021   Alcohol abuse with alcohol-induced mood disorder ([place]) 02/13/2018     Problem List Items Addressed This Visit       Cardiovascular and Mediastinum   External hemorrhoid   Relevant Medications   spironolactone (ALDACTONE) 100 MG tablet     Other   Human immunodeficiency virus (HIV) disease ([place]) - Primary   Relevant Medications   valACYclovir (VALTREX) 500 MG tablet   dolutegravir-rilpivirine (JULUCA) 50-25 MG tablet   Other Relevant Orders   HIV-1 RNA quant-no reflex-bld  T-helper cells (CD4) count (not at [facility])   Gender dysphoria   Relevant Medications   medroxyPROGESTERone (PROVERA) 2.5 MG tablet   Screening for STDs (sexually transmitted diseases)   Relevant Orders   C. trachomatis/N. gonorrhoeae RNA   GC/CT Probe, Amp (Throat)   CT/NG RNA, TMA Rectal   Hormone replacement therapy (HRT)   Male-to-male transgender person   Relevant Medications   spironolactone (ALDACTONE) 100 MG tablet   estradiol valerate (DELESTROGEN) 20 MG/ML injection   medroxyPROGESTERone (PROVERA) 2.5 MG tablet   Other Relevant Orders   Estradiol   Testosterone, free     I am having Mitchell D. Hillock "Kiki" maintain her ondansetron, spironolactone, estradiol valerate, valACYclovir, medroxyPROGESTERone, and Juluca.   Meds ordered this encounter  Medications   spironolactone (ALDACTONE) 100 MG tablet    Sig: Take 1 tablet (100 mg total) by mouth daily.    Dispense:  30 tablet    Refill:  5    Order Specific Question:   Supervising Provider    Answer:   VAN DAM, CORNELIUS N [[id number]]   estradiol valerate (DELESTROGEN) 20 MG/ML injection    Sig: Inject 1 mL (20 mg total) into the muscle every 14 (fourteen) days.    Dispense:  5 mL    Refill:  4    Order Specific Question:   Supervising Provider    Answer:   VAN DAM,  CORNELIUS N [[id number]]   valACYclovir (VALTREX) 500 MG tablet    Sig: Take 1 tablet (500 mg total) by mouth daily.    Dispense:  30 tablet    Refill:  0    Order Specific Question:   Supervising Provider    Answer:   VAN DAM, CORNELIUS N [[id number]]   medroxyPROGESTERone (PROVERA) 2.5 MG tablet    Sig: Take 1 tablet (2.5 mg total) by mouth daily.    Dispense:  30 tablet    Refill:  1    Order Specific Question:   Supervising Provider    Answer:   VAN DAM, CORNELIUS N [[id number]]   dolutegravir-rilpivirine (JULUCA) 50-25 MG tablet    Sig: Take 1 tablet by mouth daily before lunch.    Dispense:  30 tablet    Refill:  5    Order Specific Question:   Supervising Provider    Answer:   Tommy Medal, CORNELIUS N [[id number]]     Follow-up: Return in about 4 months (around 03/08/2022) for Follow up.

## 2021-11-06 NOTE — Patient Instructions (Addendum)
Refilled delestrogen continue to take 1 ml every 2 weeks, nurse able to administer today and counsel on preper technique Renewed HMAP today Refilled jeluca, will try Dovato next appointment for nausea Refilled Aldactone Medroxy provera sent to [facility] out patient Condoms always Need to stop smoking Refilled acyclovir OTC Miralax to softn bowel movements, warm sitz salt bath for hemorrhoids, increase water intake more green vegetables Schedule counseling visit on way out I will follow up with you in 4 months

## 2021-11-09 ENCOUNTER — Encounter: Payer: Self-pay | Admitting: Physician Assistant

## 2021-11-10 LAB — T-HELPER CELLS (CD4) COUNT (NOT AT ARMC)
Absolute CD4: 1240 cells/uL (ref 490–1740)
CD4 T Helper %: 38 % (ref 30–61)
Total lymphocyte count: 3279 cells/uL (ref 850–3900)

## 2021-11-10 LAB — GC/CHLAMYDIA PROBE, AMP (THROAT)
Chlamydia trachomatis RNA: NOT DETECTED
Neisseria gonorrhoeae RNA: NOT DETECTED

## 2021-11-10 LAB — C. TRACHOMATIS/N. GONORRHOEAE RNA
C. trachomatis RNA, TMA: NOT DETECTED
N. gonorrhoeae RNA, TMA: NOT DETECTED

## 2021-11-10 LAB — TESTOSTERONE, FREE: TESTOSTERONE FREE: 0.7 pg/mL — ABNORMAL LOW (ref 46.0–224.0)

## 2021-11-10 LAB — ESTRADIOL: Estradiol: 59 pg/mL — ABNORMAL HIGH (ref <=39)

## 2021-11-10 LAB — RPR: RPR Ser Ql: NONREACTIVE

## 2021-11-10 LAB — HIV-1 RNA QUANT-NO REFLEX-BLD
HIV 1 RNA Quant: NOT DETECTED Copies/mL
HIV-1 RNA Quant, Log: NOT DETECTED Log cps/mL

## 2021-11-10 LAB — CT/NG RNA, TMA RECTAL
Chlamydia Trachomatis RNA: NOT DETECTED
Neisseria Gonorrhoeae RNA: NOT DETECTED

## 2021-11-16 ENCOUNTER — Ambulatory Visit: Payer: Self-pay | Admitting: Physician Assistant

## 2021-11-21 ENCOUNTER — Other Ambulatory Visit (HOSPITAL_COMMUNITY): Payer: Self-pay

## 2021-11-24 ENCOUNTER — Ambulatory Visit: Payer: Self-pay

## 2021-12-01 ENCOUNTER — Ambulatory Visit: Payer: Self-pay

## 2021-12-22 ENCOUNTER — Encounter (HOSPITAL_COMMUNITY): Payer: Self-pay | Admitting: Emergency Medicine

## 2021-12-22 ENCOUNTER — Ambulatory Visit (HOSPITAL_COMMUNITY)
Admission: EM | Admit: 2021-12-22 | Discharge: 2021-12-22 | Disposition: A | Discharge status: Home / Self Care | Payer: Self-pay | Attending: Family Medicine | Admitting: Family Medicine

## 2021-12-22 DIAGNOSIS — J029 Acute pharyngitis, unspecified: Secondary | ICD-10-CM

## 2021-12-22 DIAGNOSIS — B349 Viral infection, unspecified: Secondary | ICD-10-CM

## 2021-12-22 DIAGNOSIS — R3129 Other microscopic hematuria: Secondary | ICD-10-CM

## 2021-12-22 DIAGNOSIS — Z1152 Encounter for screening for COVID-19: Secondary | ICD-10-CM | POA: Insufficient documentation

## 2021-12-22 LAB — POCT URINALYSIS DIPSTICK, ED / UC
Bilirubin Urine: NEGATIVE
Glucose, UA: NEGATIVE mg/dL
Ketones, ur: NEGATIVE mg/dL
Leukocytes,Ua: NEGATIVE
Nitrite: NEGATIVE
Protein, ur: NEGATIVE mg/dL
Specific Gravity, Urine: >=1.03 (ref 1.005–1.030)
Urobilinogen, UA: 0.2 mg/dL (ref 0.0–1.0)
pH: 5.5 (ref 5.0–8.0)

## 2021-12-22 LAB — POC URINE PREG, ED: Preg Test, Ur: NEGATIVE

## 2021-12-22 LAB — POCT RAPID STREP A, ED / UC: Streptococcus, Group A Screen (Direct): NEGATIVE

## 2021-12-22 MED ORDER — ALBUTEROL SULFATE HFA 108 (90 BASE) MCG/ACT IN AERS
INHALATION_SPRAY | RESPIRATORY_TRACT | Status: AC
  Filled 2021-12-22: qty 6.7

## 2021-12-22 MED ORDER — ALBUTEROL SULFATE HFA 108 (90 BASE) MCG/ACT IN AERS
2.0000 | INHALATION_SPRAY | Freq: Once | RESPIRATORY_TRACT | Status: AC
  Administered 2021-12-22: 2 via RESPIRATORY_TRACT

## 2021-12-22 NOTE — ED Triage Notes (Signed)
Pt reports a sore throat x 1 day, headache began within the last hour and body aches since lastnight . Also endorses generalized abdominal pain x 2 day and peeing out blood and blood clots 2 weeks ago.

## 2021-12-22 NOTE — Discharge Instructions (Addendum)
Negative strep test. I recommend symptomatic care such as tylenol or ibuprofen for pain, mucinex for nasal congestion, lots of fluids. You can also try throat lozenges and salt water gargles.  We will call you if your covid test returns positive.   Please use the inhaler every 6 hours as needed. I recommend follow up with your primary care provider regarding asthma, and have them repeat your urine test in 4-6 weeks.

## 2021-12-22 NOTE — ED Provider Notes (Addendum)
[facility]    CSN: [id number] Arrival date & time: 12/22/21  1516      History   Chief Complaint Chief Complaint  Patient presents with   Headache   Abdominal Pain   Sore Throat   Hematuria    HPI Mitchell Romero is a 36 y.o. adult.  Presents with multiple concerns Sore throat since yesterday.  Pain with swallowing.  Did not try any medications.  Today developed headache, body aches, chills.  Some nasal congestion.  No fevers  Reports 2 weeks ago she had 3 days in a row of peeing out blood None in the last week.  Denies any bladder or abdominal pain.  No vomiting or diarrhea.  No penile discharge or discomfort No history of kidney stone  Reports history of asthma, does not have an inhaler, has not had problems in many years.  Feels like she may be having an asthma flareup with her current symptoms.  Denies shortness of breath or chest pain  Sister sick with similar symptoms, started a few days before her  Past Medical History:  Diagnosis Date   Asthma    HIV (human immunodeficiency virus infection) ([place])    Hypertension    PCP took off HTN meds   Pancreatic cancer [facility])     Patient Active Problem List   Diagnosis Date Noted   External hemorrhoid 11/06/2021   Male-to-male transgender person 07/15/2021   Gender dysphoria 04/28/2021   Screening for STDs (sexually transmitted diseases) 04/28/2021   Hormone replacement therapy (HRT) 04/28/2021   Peripheral edema 04/28/2021   Human immunodeficiency virus (HIV) disease ([place]) 03/26/2021   Alcohol abuse with alcohol-induced mood disorder ([place]) 02/13/2018    History reviewed. No pertinent surgical history.     Home Medications    Prior to Admission medications   Medication Sig Start Date End Date Taking? Authorizing Provider  dolutegravir-rilpivirine (JULUCA) 50-25 MG tablet Take 1 tablet by mouth daily before lunch. 11/06/21   Robert Bellow, PA-C  estradiol valerate (DELESTROGEN) 20 MG/ML  injection Inject 1 mL (20 mg total) into the muscle every 14 (fourteen) days. 11/06/21   Robert Bellow, PA-C  medroxyPROGESTERone (PROVERA) 2.5 MG tablet Take 1 tablet (2.5 mg total) by mouth daily. 11/06/21   Robert Bellow, PA-C  ondansetron (ZOFRAN-ODT) 4 MG disintegrating tablet Take 1 tablet (4 mg total) by mouth every 8 (eight) hours as needed for nausea or vomiting. 09/04/21   Raspet, Derry Skill, PA-C  spironolactone (ALDACTONE) 100 MG tablet Take 1 tablet (100 mg total) by mouth daily. 11/06/21   Robert Bellow, PA-C  SYRINGE-NEEDLE, DISP, 3 ML 21G X 1" 3 ML MISC USE TO INJECT ESTRADOL 12/23/21   Robert Bellow, PA-C  valACYclovir (VALTREX) 500 MG tablet Take 1 tablet (500 mg total) by mouth daily. 11/06/21   Robert Bellow, PA-C    Family History Family History  Problem Relation Age of Onset   Heart failure Mother    Stroke Mother    Diabetes Mother    Sickle cell trait Mother    Diabetes Father    Heart failure Father    Stroke Father     Social History Social History   Tobacco Use   Smoking status: Every Day    Packs/day: 1.00    Types: Cigarettes   Smokeless tobacco: Never  Vaping Use   Vaping Use: Never used  Substance Use Topics   Alcohol use: Not Currently    Comment: occasional  Drug use: Yes    Types: Marijuana     Allergies   Peanut-containing drug products, Food, Other, Latex, and Powder   Review of Systems Review of Systems Per HPI  Physical Exam Triage Vital Signs ED Triage Vitals  Enc Vitals Group     BP 12/22/21 1633 98/67     Pulse Rate 12/22/21 1633 86     Resp 12/22/21 1633 19     Temp 12/22/21 1633 98.1 F (36.7 C)     Temp Source 12/22/21 1633 Oral     SpO2 12/22/21 1633 98 %     Weight --      Height --      Head Circumference --      Peak Flow --      Pain Score 12/22/21 1632 6     Pain Loc --      Pain Edu? --      Excl. in [place]? --    No data found.  Updated Vital Signs BP 98/67 (BP Location: Left Arm)   Pulse  86   Temp 98.1 F (36.7 C) (Oral)   Resp 19   SpO2 98%    Physical Exam Vitals and nursing note reviewed.  Constitutional:      General: She is not in acute distress.    Appearance: Normal appearance.  HENT:     Mouth/Throat:     Mouth: Mucous membranes are moist.     Pharynx: Oropharynx is clear. Posterior oropharyngeal erythema present.     Tonsils: No tonsillar exudate or tonsillar abscesses. 1+ on the right. 1+ on the left.  Cardiovascular:     Rate and Rhythm: Normal rate and regular rhythm.     Pulses: Normal pulses.     Heart sounds: Normal heart sounds.  Pulmonary:     Effort: Pulmonary effort is normal.     Breath sounds: Normal breath sounds.  Abdominal:     Tenderness: There is no abdominal tenderness. There is no guarding.  Musculoskeletal:        General: Normal range of motion.  Lymphadenopathy:     Cervical: No cervical adenopathy.  Skin:    General: Skin is warm and dry.  Neurological:     Mental Status: She is alert and oriented to person, place, and time.      UC Treatments / Results  Labs (all labs ordered are listed, but only abnormal results are displayed) Labs Reviewed  POCT URINALYSIS DIPSTICK, ED / UC - Abnormal; Notable for the following components:      Result Value   Hgb urine dipstick TRACE (*)    All other components within normal limits  SARS CORONAVIRUS 2 (TAT 6-24 HRS)  POC URINE PREG, ED  POCT RAPID STREP A, ED / UC    EKG  Radiology No results found.  Procedures Procedures (including critical care time)  Medications Ordered in UC Medications  albuterol (VENTOLIN HFA) 108 (90 Base) MCG/ACT inhaler 2 puff (2 puffs Inhalation Given 12/22/21 1741)    Initial Impression / Assessment and Plan / UC Course  I have reviewed the triage vital signs and the nursing notes.  Pertinent labs & imaging results that were available during my care of the patient were reviewed by me and considered in my medical decision making (see chart  for details).  Patient requesting strep test, resulted negative  An earlier provider had mistakenly ordered urine pregnancy test. Resulted before this provider canceled the order. Please disregard.  Urinalysis with  trace leuks. No flank/groin pain. Recommend retest with primary care in 4-6 weeks.  Viral illness Covid test pending Discussed symptomatic care including ibuprofen, Tylenol, Mucinex, lots of fluids Patient feels she's having an asthma flare up, although appears well in no acute distress and lungs are clear. She would like an inhaler here and to take home. 2 puffs albuterol inhaler in clinic, patient can use the rest of inhaler q6 hours prn. Recommend follow up with PCP for further evaluation and management as needed  Return precautions discussed. Patient agrees to plan  Final Clinical Impressions(s) / UC Diagnoses   Final diagnoses:  Viral illness  Viral pharyngitis  Other microscopic hematuria     Discharge Instructions      Negative strep test. I recommend symptomatic care such as tylenol or ibuprofen for pain, mucinex for nasal congestion, lots of fluids. You can also try throat lozenges and salt water gargles.  We will call you if your covid test returns positive.   Please use the inhaler every 6 hours as needed. I recommend follow up with your primary care provider regarding asthma, and have them repeat your urine test in 4-6 weeks.    ED Prescriptions   None    PDMP not reviewed this encounter.   Jequan Shahin, Vernice Jefferson 12/22/21 1819    Makynleigh Breslin, Wells Guiles, PA-C 12/25/21 1018

## 2021-12-23 ENCOUNTER — Other Ambulatory Visit: Payer: Self-pay

## 2021-12-23 LAB — SARS CORONAVIRUS 2 (TAT 6-24 HRS): SARS Coronavirus 2: NEGATIVE

## 2021-12-23 MED ORDER — "SYRINGE/NEEDLE (DISP) 21G X 1"" 3 ML MISC": 3 refills | Status: AC

## 2022-01-02 ENCOUNTER — Emergency Department (HOSPITAL_COMMUNITY): Payer: Self-pay

## 2022-01-02 ENCOUNTER — Other Ambulatory Visit: Payer: Self-pay

## 2022-01-02 ENCOUNTER — Emergency Department (HOSPITAL_COMMUNITY)
Admission: EM | Admit: 2022-01-02 | Discharge: 2022-01-03 | Discharge status: Against Medical Advice | Payer: Self-pay | Attending: Medical | Admitting: Medical

## 2022-01-02 ENCOUNTER — Encounter (HOSPITAL_COMMUNITY): Payer: Self-pay

## 2022-01-02 DIAGNOSIS — R319 Hematuria, unspecified: Secondary | ICD-10-CM | POA: Insufficient documentation

## 2022-01-02 DIAGNOSIS — R1032 Left lower quadrant pain: Secondary | ICD-10-CM | POA: Insufficient documentation

## 2022-01-02 DIAGNOSIS — Z5321 Procedure and treatment not carried out due to patient leaving prior to being seen by health care provider: Secondary | ICD-10-CM | POA: Insufficient documentation

## 2022-01-02 DIAGNOSIS — R350 Frequency of micturition: Secondary | ICD-10-CM | POA: Insufficient documentation

## 2022-01-02 MED ORDER — HYDROCODONE-ACETAMINOPHEN 5-325 MG PO TABS
1.0000 | ORAL_TABLET | Freq: Once | ORAL | Status: AC
  Administered 2022-01-02: 1 via ORAL
  Filled 2022-01-02: qty 1

## 2022-01-02 NOTE — ED Provider Triage Note (Signed)
Emergency Medicine Provider Triage Evaluation Note  Besnik Derrell Nicoll , a 36 y.o. adult  was evaluated in triage.  Pt complains of blood in urine x1 day. Peeing out blood clots. Painful to urinate. Last urinated about an hour ago. Reports increased frequency, urgency. Male to male, no bottom surgery yet. On estrogen replacement. Reports L flank pain.  Review of Systems  Positive: hematuria Negative: Nausea, vomiting  Physical Exam  There were no vitals taken for this visit. Gen:   Awake, no distress   Resp:  Normal effort  MSK:   Moves extremities without difficulty  Other:  No CVA or abdominal ttp  Medical Decision Making  Medically screening exam initiated at 4:23 PM.  Appropriate orders placed.  Edgar Derrell Almgren was informed that the remainder of the evaluation will be completed by another provider, this initial triage assessment does not replace that evaluation, and the importance of remaining in the ED until their evaluation is complete.     Osvaldo Shipper, [place] 01/02/22 1627

## 2022-01-02 NOTE — ED Triage Notes (Signed)
Last night patient said he was peeing blood clots. Today he said it feels like a blood clot is stuck and he is in pain. Was able to urinate an hour ago.

## 2022-03-08 ENCOUNTER — Ambulatory Visit: Payer: BLUE CROSS/BLUE SHIELD | Admitting: Physician Assistant

## 2022-04-05 ENCOUNTER — Telehealth: Payer: Self-pay

## 2022-04-05 NOTE — Telephone Encounter (Signed)
Received refill request for valacyclovir, patient is due for appointment. Called to schedule, no answer and voicemail greeting did not match patient's name. Did not leave message.   Called second number listed, call could not be completed.   Beryle Flock, RN

## 2022-04-23 ENCOUNTER — Encounter: Payer: Self-pay | Admitting: Physician Assistant

## 2022-04-23 ENCOUNTER — Other Ambulatory Visit (HOSPITAL_COMMUNITY): Payer: Self-pay

## 2022-04-23 ENCOUNTER — Ambulatory Visit (INDEPENDENT_AMBULATORY_CARE_PROVIDER_SITE_OTHER): Payer: Self-pay | Admitting: Physician Assistant

## 2022-04-23 ENCOUNTER — Other Ambulatory Visit: Payer: Self-pay

## 2022-04-23 VITALS — BP 118/82 | HR 101 | Temp 97.5°F | Ht 71.0 in | Wt 225.0 lb

## 2022-04-23 DIAGNOSIS — Z113 Encounter for screening for infections with a predominantly sexual mode of transmission: Secondary | ICD-10-CM

## 2022-04-23 DIAGNOSIS — Z23 Encounter for immunization: Secondary | ICD-10-CM

## 2022-04-23 DIAGNOSIS — K6289 Other specified diseases of anus and rectum: Secondary | ICD-10-CM

## 2022-04-23 DIAGNOSIS — B20 Human immunodeficiency virus [HIV] disease: Secondary | ICD-10-CM

## 2022-04-23 DIAGNOSIS — Z1212 Encounter for screening for malignant neoplasm of rectum: Secondary | ICD-10-CM

## 2022-04-23 DIAGNOSIS — Z79899 Other long term (current) drug therapy: Secondary | ICD-10-CM

## 2022-04-23 DIAGNOSIS — R310 Gross hematuria: Secondary | ICD-10-CM

## 2022-04-23 DIAGNOSIS — B009 Herpesviral infection, unspecified: Secondary | ICD-10-CM

## 2022-04-23 DIAGNOSIS — Z789 Other specified health status: Secondary | ICD-10-CM

## 2022-04-23 MED ORDER — VALACYCLOVIR HCL 500 MG PO TABS: 500.0000 mg | ORAL_TABLET | Freq: Two times a day (BID) | ORAL | 11 refills | Status: AC

## 2022-04-23 MED ORDER — CEFTRIAXONE SODIUM 500 MG IJ SOLR
500.0000 mg | Freq: Once | INTRAMUSCULAR | Status: AC
  Administered 2022-04-23: 500 mg via INTRAMUSCULAR

## 2022-04-23 MED ORDER — CIPROFLOXACIN HCL 500 MG PO TABS: 500.0000 mg | ORAL_TABLET | Freq: Two times a day (BID) | ORAL | 0 refills | Status: DC

## 2022-04-23 MED ORDER — DOXYCYCLINE HYCLATE 100 MG PO TABS: 100.0000 mg | ORAL_TABLET | Freq: Two times a day (BID) | ORAL | 0 refills | Status: AC

## 2022-04-23 NOTE — Patient Instructions (Addendum)
[facility] case management today Pneumonia and flu vaccine today Anal pap today to screen for anal cancer HPV Always take jeluca daily  Will schedule screening for hormone study, discontinue estradiol and spironolactone until visit Referral to urology Rocephin today for potential STI Doxy '100mg'$  twice daily x 7 days Condoms. Lubricant always Valtrex 500 mg bid for suppression of herpes

## 2022-04-23 NOTE — Progress Notes (Signed)
Subjective:    Patient ID: Mitchell Romero, adult    DOB: September 30, 1985, 37 y.o.   MRN: [id number]  Chief Complaint  Patient presents with   Follow-up    HIV, rectal pain, STI screening, housing, urinary symptoms     HPI:  Mitchell Romero is a 37 y.o. adult TGW presenting today for follow up of HIV-1.  She has been intermittently adherent to [place] since last OPV 11/06/2021. She denies any adverse events related to [place]. Last VL was undetectable and CD4 1,240. She is current with [employer] insurance. She does describes some barrier regarding housing. She has been living on friends couches and in hotels. She established with Carlis Abbott today and will set up case management.  KiKi stated she may move to [place] as they have a robust HIV program which includes housing. She has strong friendships and family support. She has had some depressed mood recently due to various sexual partners.  Drug use: THC Alcohol: rare Tobacco: 1 pack per 3 days since age 20. Not ready to quit, understands risks related with hormone therapy for thrombotic events.  She had 2 ED visits (10.31.23 and 11.14.23)and reported gross hematuria, urinary frequency, urgency, dysuria.  She was prescribed Levaquin 750 11.14.23, however, she did not pick up medication from pharmacy.  STI screening was negative at the time. She does reports some chills, lower back pain, suprapubic discomfort, flank pain. No history of kidney stones.    Mitchell Romero also endorses rectal pain x 2 months. She has had 3 new partners since September 2023 condomless, versatile however mostly engages in receptive anal intercourse. Reports pain with penetration in "bladder area." Denies melena, BRB, constipation. Does have hx of hemorrhoids.    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   Gender dysphoria: has had spotty compliance with estradiol and spironolcatone, did not attend  referral for surgical consult for breast augmentation, would like chondrolaryngoplasty. Discussed Get [employer] Right study and expressed interest. She will d/c current FHT and screen for A5403 study in 6 weeks.        Contact this number: [phone]    Allergies  Allergen Reactions   Peanut-Containing Drug Products Anaphylaxis   Food Other (See Comments)    Tomatoes cause acid reflux Orange Juice causes vomiting   Other Swelling    Pecans and other tree nuts   Latex Hives   Powder Rash    Powder inside gloves      Outpatient Medications Prior to Visit  Medication Sig Dispense Refill   dolutegravir-rilpivirine (JULUCA) 50-25 MG tablet Take 1 tablet by mouth daily before lunch. 30 tablet 5   estradiol valerate (DELESTROGEN) 20 MG/ML injection Inject 1 mL (20 mg total) into the muscle every 14 (fourteen) days. 5 mL 4   medroxyPROGESTERone (PROVERA) 2.5 MG tablet Take 1 tablet (2.5 mg total) by mouth daily. 30 tablet 1   ondansetron (ZOFRAN-ODT) 4 MG disintegrating tablet Take 1 tablet (4 mg total) by mouth every 8 (eight) hours as needed for nausea or vomiting. 20 tablet 0   spironolactone (ALDACTONE) 100 MG tablet Take 1 tablet (100 mg total) by mouth daily. 30 tablet 5   SYRINGE-NEEDLE, DISP, 3 ML 21G X 1" 3 ML MISC USE TO INJECT ESTRADOL 50 each 3   valACYclovir (VALTREX) 500 MG tablet Take 1 tablet (500 mg total) by mouth daily. 30 tablet 0   No facility-administered medications prior to visit.  Past Medical History:  Diagnosis Date   Asthma    HIV (human immunodeficiency virus infection) ([place])    Hypertension    PCP took off HTN meds   Pancreatic cancer ([place])      History reviewed. No pertinent surgical history.     Review of Systems  Constitutional:  Positive for chills. Negative for appetite change, diaphoresis, fatigue and fever.  HENT:  Negative for mouth sores and sinus pressure.   Respiratory:  Negative for cough, shortness of breath and wheezing.    Cardiovascular:  Negative for chest pain, palpitations and leg swelling.  Gastrointestinal:  Positive for abdominal pain (suprapubic) and rectal pain. Negative for abdominal distention, anal bleeding, blood in stool, constipation, diarrhea, nausea and vomiting.  Genitourinary:  Positive for dyspareunia, dysuria, flank pain (worse on right lower back), frequency, hematuria and urgency. Negative for genital sores, penile discharge, penile pain, penile swelling, scrotal swelling and testicular pain.  Musculoskeletal:  Positive for back pain (LBP intermittent).  Skin: Negative.   Allergic/Immunologic: Positive for environmental allergies and food allergies. Negative for immunocompromised state.  Neurological:  Negative for dizziness, weakness and light-headedness.  Hematological:  Negative for adenopathy.  Psychiatric/Behavioral:  Positive for behavioral problems (depressed mood). Negative for agitation and suicidal ideas. The patient is not nervous/anxious.       Objective:    BP 118/82   Pulse (!) 101   Temp (!) 97.5 F (36.4 C) (Temporal)   Ht '5\' 11"'$  (1.803 m)   Wt 225 lb (102.1 kg)   BMI 31.38 kg/m  Nursing note and vital signs reviewed.  Physical Exam Vitals reviewed. Exam conducted with a chaperone present.  Constitutional:      General: She is not in acute distress.    Appearance: Normal appearance. She is obese. She is not ill-appearing, toxic-appearing or diaphoretic.  HENT:     Head: Normocephalic and atraumatic.     Mouth/Throat:     Mouth: Mucous membranes are moist.     Pharynx: Oropharynx is clear. No posterior oropharyngeal erythema.  Eyes:     Extraocular Movements: Extraocular movements intact.     Conjunctiva/sclera: Conjunctivae normal.     Pupils: Pupils are equal, round, and reactive to light.  Cardiovascular:     Rate and Rhythm: Normal rate and regular rhythm.     Pulses: Normal pulses.     Heart sounds: Normal heart sounds. No murmur heard.    No  friction rub. No gallop.  Pulmonary:     Effort: Pulmonary effort is normal. No respiratory distress.     Breath sounds: Normal breath sounds. No stridor. No wheezing, rhonchi or rales.  Chest:     Chest wall: No tenderness.  Abdominal:     General: Bowel sounds are normal. There is no distension.     Palpations: Abdomen is soft.     Tenderness: There is no right CVA tenderness, left CVA tenderness, guarding or rebound.  Genitourinary:    Prostate: Normal.     Rectum: Tenderness and external hemorrhoid (not thrombosed or tender to palpation) present. No anal fissure. Normal anal tone.  Skin:    General: Skin is warm and dry.  Neurological:     Mental Status: She is alert.  Psychiatric:        Mood and Affect: Mood normal.        Behavior: Behavior normal.        Thought Content: Thought content normal.        Judgment: Judgment normal.  04/23/2022    9:21 AM 11/06/2021   11:17 AM 07/14/2021   10:05 AM 04/28/2021   10:18 AM  Depression screen PHQ 2/9  Decreased Interest 0 0 0 0  Down, Depressed, Hopeless '1 1 1 1  '$ PHQ - 2 Score '1 1 1 1       '$ Assessment & Plan:  HIV-1: stressed importance of daily Jeluca and adherence, can lead to resistant virus and mutations, reviewed VL and Cd4 from 10/2021. Condoms and lubricant provided today.   STI screening: GC/C x 3 sites, harm reductions strategies, HEP B, C screening, RPR, rectal discomfort may be referred from ongoing urinary symptoms and untreated cystitis or STI  Health maintenance: flu and prevnar today  HSV-2: refilled valtrex 500 bid daily for suppressive therapy  Urinary symptoms: UA with Cx, referral to urology-Gross hematuria reported for several months  Gender dysphoria: no refills on FHT, will order estradiol and testosterone. Has not been compliant with spironolactone or estradiol injections. Interested in [facility] which will provide close follow up and estradiol to participants. ICF provided to review and  consider. She will need to d/c all current FHT to be eligible.  She understands and is willing appt for screening 06/03/22.    Patient Active Problem List   Diagnosis Date Noted   External hemorrhoid 11/06/2021   Male-to-male transgender person 07/15/2021   Gender dysphoria 04/28/2021   Screening for STDs (sexually transmitted diseases) 04/28/2021   Hormone replacement therapy (HRT) 04/28/2021   Peripheral edema 04/28/2021   Human immunodeficiency virus (HIV) disease ([place]) 03/26/2021   Alcohol abuse with alcohol-induced mood disorder ([place]) 02/13/2018     Problem List Items Addressed This Visit       Other   Human immunodeficiency virus (HIV) disease ([place]) - Primary   Relevant Medications   valACYclovir (VALTREX) 500 MG tablet   Other Relevant Orders   T-helper cells (CD4) count (not at [facility])   HIV-1 RNA quant-no reflex-bld   COMPLETE METABOLIC PANEL WITH GFR   CBC with Differential/Platelet   Cytology - PAP( )   Hepatitis B surface antigen   Hepatitis C antibody   Testosterone, free   Male-to-male transgender person   Relevant Orders   Estradiol   Testosterone, free   Other Visit Diagnoses     Pharmacologic therapy       Relevant Orders   Lipid panel   Hemoglobin A1c   Rectal pain       Relevant Medications   cefTRIAXone (ROCEPHIN) injection 500 mg (Completed)   doxycycline (VIBRA-TABS) 100 MG tablet   Gross hematuria       Relevant Orders   Ambulatory referral to Urology   Urinalysis w microscopic + reflex cultur   HSV-2 infection       Relevant Medications   valACYclovir (VALTREX) 500 MG tablet   cefTRIAXone (ROCEPHIN) injection 500 mg (Completed)   Screening for rectal cancer       Routine screening for STI (sexually transmitted infection)       Relevant Orders   C. trachomatis/N. gonorrhoeae RNA   RPR   CT/NG RNA, TMA Rectal   GC/CT Probe, Amp (Throat)   Need for pneumococcal 20-valent conjugate vaccination       Relevant Orders    Pneumococcal conjugate vaccine 20-valent (Prevnar-20) (Completed)   Need for immunization against influenza       Relevant Orders   Flu Vaccine QUAD 42moIM (Fluarix, Fluzone & Alfiuria Quad PF) (Completed)  I have discontinued Ceasar D. Henshaw "Kiki"'s ciprofloxacin. I have also changed her valACYclovir. Additionally, I am having her start on doxycycline. Lastly, I am having her maintain her ondansetron, spironolactone, estradiol valerate, medroxyPROGESTERone, Juluca, and SYRINGE-NEEDLE (DISP) 3 ML. We administered cefTRIAXone.   Meds ordered this encounter  Medications   DISCONTD: ciprofloxacin (CIPRO) 500 MG tablet    Sig: Take 1 tablet (500 mg total) by mouth 2 (two) times daily.    Dispense:  20 tablet    Refill:  0    Order Specific Question:   Supervising Provider    Answer:   VAN DAM, CORNELIUS N [[id number]]   valACYclovir (VALTREX) 500 MG tablet    Sig: Take 1 tablet (500 mg total) by mouth 2 (two) times daily.    Dispense:  60 tablet    Refill:  11    Order Specific Question:   Supervising Provider    Answer:   VAN DAM, CORNELIUS N [[id number]]   cefTRIAXone (ROCEPHIN) injection 500 mg   doxycycline (VIBRA-TABS) 100 MG tablet    Sig: Take 1 tablet (100 mg total) by mouth 2 (two) times daily for 7 days.    Dispense:  14 tablet    Refill:  0    Order Specific Question:   Supervising Provider    Answer:   VAN DAM, CORNELIUS N [id number]     Follow-up: Return in about 4 weeks (around 05/21/2022) for b20.

## 2022-04-24 LAB — CT/NG RNA, TMA RECTAL
Chlamydia Trachomatis RNA: NOT DETECTED
Neisseria Gonorrhoeae RNA: NOT DETECTED

## 2022-04-24 LAB — C. TRACHOMATIS/N. GONORRHOEAE RNA
C. trachomatis RNA, TMA: NOT DETECTED
N. gonorrhoeae RNA, TMA: NOT DETECTED

## 2022-04-27 LAB — T-HELPER CELLS (CD4) COUNT (NOT AT ARMC)
Absolute CD4: 1085 cells/uL (ref 490–1740)
CD4 T Helper %: 34 % (ref 30–61)
Total lymphocyte count: 3163 cells/uL (ref 850–3900)

## 2022-04-27 LAB — CBC WITH DIFFERENTIAL/PLATELET
Absolute Monocytes: 726 cells/uL (ref 200–950)
Basophils Absolute: 26 cells/uL (ref 0–200)
Basophils Relative: 0.2 %
Eosinophils Absolute: 264 cells/uL (ref 15–500)
Eosinophils Relative: 2 %
HCT: 39.6 % (ref 38.5–50.0)
Hemoglobin: 13.7 g/dL (ref 13.2–17.1)
Lymphs Abs: 3432 cells/uL (ref 850–3900)
MCH: 32.2 pg (ref 27.0–33.0)
MCHC: 34.6 g/dL (ref 32.0–36.0)
MCV: 93 fL (ref 80.0–100.0)
MPV: 11.2 fL (ref 7.5–12.5)
Monocytes Relative: 5.5 %
Neutro Abs: 8752 cells/uL — ABNORMAL HIGH (ref 1500–7800)
Neutrophils Relative %: 66.3 %
Platelets: 330 10*3/uL (ref 140–400)
RBC: 4.26 10*6/uL (ref 4.20–5.80)
RDW: 12.5 % (ref 11.0–15.0)
Total Lymphocyte: 26 %
WBC: 13.2 10*3/uL — ABNORMAL HIGH (ref 3.8–10.8)

## 2022-04-27 LAB — HEMOGLOBIN A1C
Hgb A1c MFr Bld: 5.9 % of total Hgb — ABNORMAL HIGH (ref <5.7)
Mean Plasma Glucose: 123 mg/dL
eAG (mmol/L): 6.8 mmol/L

## 2022-04-27 LAB — LIPID PANEL
Cholesterol: 99 mg/dL (ref <200)
HDL: 37 mg/dL — ABNORMAL LOW (ref >=40)
LDL Cholesterol (Calc): 36 mg/dL (calc)
Non-HDL Cholesterol (Calc): 62 mg/dL (calc) (ref <130)
Total CHOL/HDL Ratio: 2.7 (calc) (ref <5.0)
Triglycerides: 182 mg/dL — ABNORMAL HIGH (ref <150)

## 2022-04-27 LAB — URINALYSIS W MICROSCOPIC + REFLEX CULTURE
Bacteria, UA: NONE SEEN /HPF
Bilirubin Urine: NEGATIVE
Glucose, UA: NEGATIVE
Hgb urine dipstick: NEGATIVE
Hyaline Cast: NONE SEEN /LPF
Ketones, ur: NEGATIVE
Leukocyte Esterase: NEGATIVE
Nitrites, Initial: NEGATIVE
Protein, ur: NEGATIVE
Specific Gravity, Urine: 1.028 (ref 1.001–1.035)
pH: 5.5 (ref 5.0–8.0)

## 2022-04-27 LAB — COMPLETE METABOLIC PANEL WITH GFR
AG Ratio: 1.6 (calc) (ref 1.0–2.5)
ALT: 17 U/L (ref 9–46)
AST: 15 U/L (ref 10–40)
Albumin: 4.2 g/dL (ref 3.6–5.1)
Alkaline phosphatase (APISO): 45 U/L (ref 36–130)
BUN: 10 mg/dL (ref 7–25)
CO2: 26 mmol/L (ref 20–32)
Calcium: 9.5 mg/dL (ref 8.6–10.3)
Chloride: 105 mmol/L (ref 98–110)
Creat: 0.73 mg/dL (ref 0.60–1.26)
Globulin: 2.7 g/dL (calc) (ref 1.9–3.7)
Glucose, Bld: 104 mg/dL — ABNORMAL HIGH (ref 65–99)
Potassium: 4.5 mmol/L (ref 3.5–5.3)
Sodium: 137 mmol/L (ref 135–146)
Total Bilirubin: 0.2 mg/dL (ref 0.2–1.2)
Total Protein: 6.9 g/dL (ref 6.1–8.1)
eGFR: 122 mL/min/{1.73_m2} (ref >=60)

## 2022-04-27 LAB — URINE CULTURE
MICRO NUMBER:: 14637691
SPECIMEN QUALITY:: ADEQUATE

## 2022-04-27 LAB — GC/CHLAMYDIA PROBE, AMP (THROAT)
Chlamydia trachomatis RNA: NOT DETECTED
Neisseria gonorrhoeae RNA: NOT DETECTED

## 2022-04-27 LAB — HEPATITIS C ANTIBODY: Hepatitis C Ab: NONREACTIVE

## 2022-04-27 LAB — ESTRADIOL: Estradiol: 470 pg/mL — ABNORMAL HIGH (ref <=39)

## 2022-04-27 LAB — HIV-1 RNA QUANT-NO REFLEX-BLD
HIV 1 RNA Quant: <20 Copies/mL — ABNORMAL HIGH
HIV-1 RNA Quant, Log: <1.3 Log cps/mL — ABNORMAL HIGH

## 2022-04-27 LAB — HEPATITIS B SURFACE ANTIGEN: Hepatitis B Surface Ag: NONREACTIVE

## 2022-04-27 LAB — RPR: RPR Ser Ql: NONREACTIVE

## 2022-04-27 LAB — CULTURE INDICATED

## 2022-04-27 LAB — TESTOSTERONE, FREE: TESTOSTERONE FREE: 0.4 pg/mL — ABNORMAL LOW (ref 46.0–224.0)

## 2022-04-28 LAB — CYTOLOGY - PAP
Comment: NEGATIVE
Diagnosis: NEGATIVE
High risk HPV: NEGATIVE

## 2022-05-04 ENCOUNTER — Telehealth: Payer: Self-pay

## 2022-05-04 NOTE — Telephone Encounter (Signed)
-----   Message from Robert Bellow, [place] sent at 05/04/2022  8:56 AM EDT ----- Please notify KiKi he estradiol is 470 and needs to be <200. We already discussed in visit to discontinue estradiol, I expect this to decrease at next visit. Her urine had a no RBCs, which suggests the blood observed in the past were due to small stones.  Referral for urology has already been placed. There was the presence of bacteria in your urine. Should be covered by the injection I gave. If you continue to have bladder discomfort I will in oral therapy. Your hemoglobin A1c is 5.9 which is in the prediabetic range. I suggest reducing carbohydrates and processed food with sugars. Kidney and liver function were normal. Cholesterol was within range. VL was undetectable and CD4 was 1085 which is excellent.  Syphilis was non reactrive as well as hep B and C, and GC/C Anal Pap was negative for HPV or malignancy

## 2022-05-18 ENCOUNTER — Ambulatory Visit: Payer: Self-pay

## 2022-06-03 ENCOUNTER — Encounter: Payer: Self-pay | Admitting: *Deleted

## 2022-06-03 ENCOUNTER — Ambulatory Visit: Payer: Self-pay | Admitting: Physician Assistant

## 2022-06-21 ENCOUNTER — Telehealth: Payer: Self-pay

## 2022-06-21 ENCOUNTER — Other Ambulatory Visit: Payer: Self-pay

## 2022-06-21 NOTE — Telephone Encounter (Signed)
We received a form as well from [place] with [employer] to be filled out and signed by [employer].  I have emailed and faxed form to [place]. A copy will be placed in triage and scan.  Jenet Durio T Pricilla Loveless
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
## 2022-07-05 ENCOUNTER — Telehealth: Payer: Self-pay

## 2022-07-05 NOTE — Telephone Encounter (Signed)
Patient called office today requesting refills for all her medication. States she would like prescriptions sent to [facility] [address] [place], [place] [address]. Would like to know if [employer] would cover her medication. Informed patient that her coverage is limited to [place] only. Is not able to use this assistance in another [place]. Has not located a local provider yet. Would like know if she can have refills sent to [facility] and have meds mailed. Advised patient call [facility] for this. Should still have refills on file. Juanita Laster, RMA

## 2023-10-27 ENCOUNTER — Telehealth: Payer: Self-pay

## 2023-10-27 NOTE — Telephone Encounter (Signed)
 Heron of the [facility]. called to confirm Mitchell Romero was on the [place] list but has moved to [place]  and is receiving care. Heron can be reached at [phone], if needed.
